# Patient Record
Sex: Female | Born: 1970 | Race: White | Hispanic: No | Smoking: Never smoker
Health system: Southern US, Community
[De-identification: ages and names within clinical notes are randomized; demographics above are authoritative.]

## PROBLEM LIST (undated history)

## (undated) DIAGNOSIS — K219 Gastro-esophageal reflux disease without esophagitis: Secondary | ICD-10-CM

## (undated) DIAGNOSIS — Z8639 Personal history of other endocrine, nutritional and metabolic disease: Secondary | ICD-10-CM

## (undated) DIAGNOSIS — E785 Hyperlipidemia, unspecified: Secondary | ICD-10-CM

## (undated) DIAGNOSIS — E039 Hypothyroidism, unspecified: Secondary | ICD-10-CM

## (undated) DIAGNOSIS — C4490 Unspecified malignant neoplasm of skin, unspecified: Secondary | ICD-10-CM

## (undated) DIAGNOSIS — T7840XA Allergy, unspecified, initial encounter: Secondary | ICD-10-CM

## (undated) DIAGNOSIS — O009 Unspecified ectopic pregnancy without intrauterine pregnancy: Secondary | ICD-10-CM

## (undated) DIAGNOSIS — G43909 Migraine, unspecified, not intractable, without status migrainosus: Secondary | ICD-10-CM

## (undated) DIAGNOSIS — T4145XA Adverse effect of unspecified anesthetic, initial encounter: Secondary | ICD-10-CM

## (undated) DIAGNOSIS — I10 Essential (primary) hypertension: Secondary | ICD-10-CM

## (undated) DIAGNOSIS — Z9889 Other specified postprocedural states: Secondary | ICD-10-CM

## (undated) DIAGNOSIS — T8859XA Other complications of anesthesia, initial encounter: Secondary | ICD-10-CM

## (undated) DIAGNOSIS — R112 Nausea with vomiting, unspecified: Secondary | ICD-10-CM

## (undated) DIAGNOSIS — K635 Polyp of colon: Secondary | ICD-10-CM

## (undated) DIAGNOSIS — E042 Nontoxic multinodular goiter: Secondary | ICD-10-CM

## (undated) HISTORY — DX: Unspecified ectopic pregnancy without intrauterine pregnancy: O00.90

## (undated) HISTORY — PX: MOHS SURGERY: SUR867

## (undated) HISTORY — DX: Allergy, unspecified, initial encounter: T78.40XA

## (undated) HISTORY — DX: Hyperlipidemia, unspecified: E78.5

## (undated) HISTORY — DX: Polyp of colon: K63.5

## (undated) HISTORY — PX: DILATION AND CURETTAGE OF UTERUS: SHX78

## (undated) HISTORY — DX: Nontoxic multinodular goiter: E04.2

---

## 2000-04-21 ENCOUNTER — Encounter: Payer: Self-pay | Admitting: Family Medicine

## 2000-04-21 ENCOUNTER — Other Ambulatory Visit: Admission: RE | Admit: 2000-04-21 | Discharge: 2000-04-21 | Payer: Self-pay | Admitting: Family Medicine

## 2000-04-21 ENCOUNTER — Encounter: Admission: RE | Admit: 2000-04-21 | Discharge: 2000-04-21 | Payer: Self-pay | Admitting: Family Medicine

## 2000-04-22 ENCOUNTER — Encounter: Payer: Self-pay | Admitting: Family Medicine

## 2000-04-22 ENCOUNTER — Encounter: Admission: RE | Admit: 2000-04-22 | Discharge: 2000-04-22 | Payer: Self-pay | Admitting: Family Medicine

## 2000-04-27 ENCOUNTER — Encounter: Admission: RE | Admit: 2000-04-27 | Discharge: 2000-04-27 | Payer: Self-pay | Admitting: Family Medicine

## 2000-04-27 ENCOUNTER — Encounter: Payer: Self-pay | Admitting: Family Medicine

## 2000-06-16 ENCOUNTER — Ambulatory Visit (HOSPITAL_COMMUNITY): Admission: RE | Admit: 2000-06-16 | Discharge: 2000-06-16 | Payer: Self-pay | Admitting: Gastroenterology

## 2001-06-21 ENCOUNTER — Encounter: Payer: Self-pay | Admitting: Family Medicine

## 2001-06-21 ENCOUNTER — Encounter: Admission: RE | Admit: 2001-06-21 | Discharge: 2001-06-21 | Payer: Self-pay | Admitting: Family Medicine

## 2001-12-16 ENCOUNTER — Encounter: Payer: Self-pay | Admitting: Gynecology

## 2001-12-16 ENCOUNTER — Encounter: Admission: RE | Admit: 2001-12-16 | Discharge: 2001-12-16 | Payer: Self-pay | Admitting: Gynecology

## 2002-05-14 ENCOUNTER — Inpatient Hospital Stay (HOSPITAL_COMMUNITY): Admission: AD | Admit: 2002-05-14 | Discharge: 2002-05-14 | Payer: Self-pay | Admitting: Gynecology

## 2002-05-14 ENCOUNTER — Encounter: Payer: Self-pay | Admitting: Gynecology

## 2002-10-25 ENCOUNTER — Encounter: Payer: Self-pay | Admitting: Family Medicine

## 2002-10-25 ENCOUNTER — Encounter: Admission: RE | Admit: 2002-10-25 | Discharge: 2002-10-25 | Payer: Self-pay | Admitting: Family Medicine

## 2003-05-15 ENCOUNTER — Other Ambulatory Visit: Admission: RE | Admit: 2003-05-15 | Discharge: 2003-05-15 | Payer: Self-pay | Admitting: Gynecology

## 2003-09-08 HISTORY — PX: LAPAROSCOPIC CHOLECYSTECTOMY: SUR755

## 2003-12-26 ENCOUNTER — Encounter: Admission: RE | Admit: 2003-12-26 | Discharge: 2003-12-26 | Payer: Self-pay | Admitting: Family Medicine

## 2004-01-09 ENCOUNTER — Encounter: Admission: RE | Admit: 2004-01-09 | Discharge: 2004-01-09 | Payer: Self-pay | Admitting: Gastroenterology

## 2004-02-07 ENCOUNTER — Ambulatory Visit (HOSPITAL_COMMUNITY): Admission: RE | Admit: 2004-02-07 | Discharge: 2004-02-08 | Payer: Self-pay | Admitting: Surgery

## 2004-02-07 ENCOUNTER — Encounter (INDEPENDENT_AMBULATORY_CARE_PROVIDER_SITE_OTHER): Payer: Self-pay | Admitting: *Deleted

## 2004-05-06 ENCOUNTER — Inpatient Hospital Stay (HOSPITAL_COMMUNITY): Admission: AD | Admit: 2004-05-06 | Discharge: 2004-05-06 | Payer: Self-pay | Admitting: Gynecology

## 2004-05-12 ENCOUNTER — Inpatient Hospital Stay (HOSPITAL_COMMUNITY): Admission: AD | Admit: 2004-05-12 | Discharge: 2004-05-12 | Payer: Self-pay | Admitting: Gynecology

## 2004-06-05 ENCOUNTER — Encounter: Admission: RE | Admit: 2004-06-05 | Discharge: 2004-06-05 | Payer: Self-pay | Admitting: Gynecology

## 2004-06-27 ENCOUNTER — Other Ambulatory Visit: Admission: RE | Admit: 2004-06-27 | Discharge: 2004-06-27 | Payer: Self-pay | Admitting: Gynecology

## 2005-07-27 ENCOUNTER — Encounter: Admission: RE | Admit: 2005-07-27 | Discharge: 2005-07-27 | Payer: Self-pay | Admitting: Gynecology

## 2005-08-20 ENCOUNTER — Other Ambulatory Visit: Admission: RE | Admit: 2005-08-20 | Discharge: 2005-08-20 | Payer: Self-pay | Admitting: Gynecology

## 2006-03-18 ENCOUNTER — Other Ambulatory Visit: Admission: RE | Admit: 2006-03-18 | Discharge: 2006-03-18 | Payer: Self-pay | Admitting: Gynecology

## 2006-03-19 ENCOUNTER — Encounter: Admission: RE | Admit: 2006-03-19 | Discharge: 2006-03-19 | Payer: Self-pay | Admitting: Family Medicine

## 2006-03-26 ENCOUNTER — Encounter: Admission: RE | Admit: 2006-03-26 | Discharge: 2006-03-26 | Payer: Self-pay | Admitting: Family Medicine

## 2006-05-06 ENCOUNTER — Encounter: Admission: RE | Admit: 2006-05-06 | Discharge: 2006-05-06 | Payer: Self-pay | Admitting: Otolaryngology

## 2006-08-09 ENCOUNTER — Encounter: Admission: RE | Admit: 2006-08-09 | Discharge: 2006-08-09 | Payer: Self-pay | Admitting: Gynecology

## 2006-09-20 ENCOUNTER — Other Ambulatory Visit: Admission: RE | Admit: 2006-09-20 | Discharge: 2006-09-20 | Payer: Self-pay | Admitting: Gynecology

## 2007-01-05 ENCOUNTER — Ambulatory Visit: Payer: Self-pay

## 2007-01-05 ENCOUNTER — Ambulatory Visit: Payer: Self-pay | Admitting: Cardiology

## 2007-01-05 LAB — CONVERTED CEMR LAB: Pro B Natriuretic peptide (BNP): 11 pg/mL (ref 0.0–100.0)

## 2007-01-20 ENCOUNTER — Encounter: Payer: Self-pay | Admitting: Cardiovascular Disease

## 2007-01-20 ENCOUNTER — Ambulatory Visit: Payer: Self-pay

## 2007-02-16 ENCOUNTER — Ambulatory Visit: Payer: Self-pay | Admitting: Cardiology

## 2007-05-17 ENCOUNTER — Other Ambulatory Visit: Admission: RE | Admit: 2007-05-17 | Discharge: 2007-05-17 | Payer: Self-pay | Admitting: Gynecology

## 2007-08-15 ENCOUNTER — Encounter: Admission: RE | Admit: 2007-08-15 | Discharge: 2007-08-15 | Payer: Self-pay | Admitting: Family Medicine

## 2007-08-16 ENCOUNTER — Encounter: Admission: RE | Admit: 2007-08-16 | Discharge: 2007-08-16 | Payer: Self-pay | Admitting: Gynecology

## 2007-08-26 ENCOUNTER — Encounter: Admission: RE | Admit: 2007-08-26 | Discharge: 2007-08-26 | Payer: Self-pay | Admitting: Family Medicine

## 2007-08-26 ENCOUNTER — Encounter (INDEPENDENT_AMBULATORY_CARE_PROVIDER_SITE_OTHER): Payer: Self-pay | Admitting: Interventional Radiology

## 2007-08-26 ENCOUNTER — Other Ambulatory Visit: Admission: RE | Admit: 2007-08-26 | Discharge: 2007-08-26 | Payer: Self-pay | Admitting: Interventional Radiology

## 2010-02-24 DIAGNOSIS — G43909 Migraine, unspecified, not intractable, without status migrainosus: Secondary | ICD-10-CM | POA: Insufficient documentation

## 2010-06-23 ENCOUNTER — Encounter: Admission: RE | Admit: 2010-06-23 | Discharge: 2010-06-23 | Payer: Self-pay | Admitting: Family Medicine

## 2010-08-28 DIAGNOSIS — K589 Irritable bowel syndrome without diarrhea: Secondary | ICD-10-CM | POA: Insufficient documentation

## 2010-09-28 ENCOUNTER — Encounter: Payer: Self-pay | Admitting: Family Medicine

## 2010-10-10 DIAGNOSIS — B009 Herpesviral infection, unspecified: Secondary | ICD-10-CM | POA: Insufficient documentation

## 2010-11-12 ENCOUNTER — Other Ambulatory Visit: Payer: Self-pay | Admitting: Endocrinology

## 2010-11-12 DIAGNOSIS — E042 Nontoxic multinodular goiter: Secondary | ICD-10-CM

## 2011-01-06 ENCOUNTER — Other Ambulatory Visit: Payer: Self-pay | Admitting: Gynecology

## 2011-01-20 NOTE — Assessment & Plan Note (Signed)
[facility]                            CARDIOLOGY OFFICE NOTE   FALYN, RUBEL                       MRN:          [id number]  DATE:02/16/2007                            DOB:          March 12, 1971    Ms. Lusher is pleasant, 40 year old female that I recently saw on January 05, 2007 for complaints of atypical chest pain and dyspnea. Note a D-  dimer was normal. We also scheduled her to have lower extremity Dopplers  that showed no evidence of DVT. She had an echocardiogram performed on  Jan 20, 2007 that showed normal LV function. There was mild mitral  regurgitation and the left atrium was mildly dilated. There was mild  tricuspid regurgitation. She also had a Myoview performed on Jan 20, 2007. There was breast attenuation but there was no significant ischemia  noted. The ejection fraction was 70%. Finally she had a BNP that was  normal at 11. Since I last saw her, she states she still has mild  dyspnea with more moderate activity but it has improved. She has had 2  brief episodes of chest pain that are in the substernal location. The  pain did not radiate. There was a pleuritic component she stated and it  lasted for approximately 15 minutes each. There is no orthopnea, PND or  pedal edema and there is no syncope.   CURRENT MEDICATIONS:  1. Topamax 50 mg p.o. daily.  2. Imitrex.  3. Calcium.  4. Vitamin D.   PHYSICAL EXAMINATION:  VITAL SIGNS:  Blood pressure of 119/86 and her  pulse was 70. She weighs 188 pounds.  HEENT:  Normal.  NECK:  Supple.  CHEST:  Clear.  CARDIOVASCULAR:  Regular rate and rhythm.  EXTREMITIES:  Show no edema.   DIAGNOSES:  Atypical chest pain/dyspnea - the etiology of this is not  clear to me. However, her Myoview shows no ischemia and her echo shows  normal left ventricular function. Her D-dimer was normal as was her  lower extremity Dopplers. There is also a BNP that was normal. I  therefore think it is very  unlikely that this is related to her cardiac  status. We have discussed this as well. Note her symptoms have improved  since I saw her before. We will therefore not pursue further cardiac  workup and I will see her back on an as needed basis. If her dyspnea  persists then she will see Dr. Duaine Dredge for consideration of pulmonary  workup.     Madolyn Frieze Jens Som, MD, [facility]  Electronically Signed   BSC/MedQ  DD: 02/16/2007  DT: 02/16/2007  Job #: [id number]   cc:   Mosetta Putt, M.D.

## 2011-01-23 NOTE — Procedures (Signed)
[facility]  Patient:    Angelica Lester, Angelica Lester                       MRN: [id number] Proc. Date: 06/16/00 Adm. Date:  [id number] Disc. Date: [id number] Attending:  Charna Elizabeth CC:         Carolyne Fiscal, M.D.   Procedure Report  DATE OF BIRTH:  [birth year]  REFERRING PHYSICIAN:  Carolyne Fiscal, M.D.  PROCEDURE PERFORMED:  Colonoscopy.  ENDOSCOPIST:  Anselmo Rod, M.D.  INSTRUMENT USED:  Olympus video colonoscope.  INDICATIONS FOR PROCEDURE:  The patient is a 40 year old white female with intrarectal cancer in a grandmother and history of blood in stool, rule out colonic polyps, masses, hemorrhoids, etc.  PREPROCEDURE PREPARATION:  Informed consent was procured from the patient. The patient was fasted for eight hours prior to the procedure and prepped with a bottle of magnesium citrate and a gallon of NuLytely the night prior to the procedure.  PREPROCEDURE PHYSICAL:  The patient had stable vital signs.  Neck supple. Chest clear to auscultation.  S1, S2 regular.  Abdomen soft with normal abdominal bowel sounds.  DESCRIPTION OF PROCEDURE:  The patient was placed in the left lateral decubitus position and sedated with 60 mg of Demerol and 7 mg of Versed intravenously.  Once the patient was adequately sedated and maintained on low-flow oxygen and continuous cardiac monitoring, the Olympus video colonoscope was advanced from the rectum to the cecum without difficulty.  The entire colonic mucosa appeared healthy with a normal vascular pattern, no masses, polyps, erosions, ulcerations or diverticula seen.  IMPRESSION:  Normal colonoscopy.  RECOMMENDATIONS: 1. Outpatient follow-up for repeat guaiac testing. 2. Increase the fluid and fiber in her diet.  Further recommendations to be made on an outpatient basis.DD:  06/16/00 TD:  06/18/00 Job: [id number]

## 2011-01-23 NOTE — Assessment & Plan Note (Signed)
[facility]                            CARDIOLOGY OFFICE NOTE   [name], [name]                     MRN:          [id number]  DATE:01/05/2007                            DOB:          Mar 18, 1971    Ms. Angelica Lester is a very pleasant 40 year Lester female with a past medical  history of mild hyperlipidemia, question irritable bowel syndrome, who I  am asked to evaluate for chest pain and dyspnea.  She has no prior  cardiac history.  She typically does not have dyspnea on exertion,  orthopnea, PND, pedal edema, palpitations, presyncope, syncope, or  exertional chest pain.  Over the past 1 month, she has noticed worsening  dyspnea on exertion.  There is no orthopnea or PND, and there is no  pedal edema.  Approximately 2 weeks ago, she had 2 different types of  chest pain.  One was described as sharp sensation in the left upper  chest.  The other was described as an elephant sitting on her chest.  The pain did not radiate.  It did increase with inspiration, but it was  not related to food.  There was no associated nausea or vomiting, but  there was minimal diaphoresis, and mild shortness of breath.  It lasted  for approximately 1-1/2 hours.  She had a 2nd episode 2 days later, but  has had no chest pain since then.  She did state that her dyspnea is  also improved, although it is still present.  She was seen by Dr.  Duaine Dredge, and he ordered laboratory.  Her CK-MB was normal.  Her  hemoglobin and hematocrit were normal.  She also had a D-dimer that was  normal at less than 0.22.  A troponin was also normal.  Chest x-ray  showed no active lung disease.  An electrocardiogram showed mild  anterior T wave inversion.  Because of her symptoms, we were asked to  further evaluate.  Of note, she has not had chest pain since a week and  a half ago.   Her medications include Topamax, Imitrex, calcium, and vitamin D.   SHE HAS NO KNOWN DRUG ALLERGIES.   SOCIAL  HISTORY:  She does not smoke, and only rarely consumes alcohol.   FAMILY HISTORY:  Positive for coronary artery disease.  Her sister had a  myocardial infarction at age 78.   PAST MEDICAL HISTORY:  There is no diabetes mellitus or hypertension,  but there is mild hyperlipidemia in the past.  There is a question of  history of irritable bowel syndrome.  She has had a prior  cholecystectomy.   REVIEW OF SYSTEMS:  She denies any headaches, fevers or chills.  There  is no productive cough or hemoptysis.  There is no dysphagia,  odynophagia, melena, or hematochezia.  There is no dysuria or hematuria.  There is no history of seizure activity.  There is no orthopnea, PND, or  pedal edema.  She has had some pain in her right lower extremity from  the knee down.  This is predominantly anteriorly.  Also, of note, she  has not had any  recent travel, or leg injury.  The remainder of the  systems are negative.   PHYSICAL EXAMINATION:  Shows a blood pressure of 118/83, and her pulse  is 66.  She weighs 185 pounds.  She is well developed, and mildly obese.  She is in no acute distress.  SKIN:  Warm and dry.  She does not appear to be depressed.  There is no peripheral clubbing.  HEENT:  Normal with normal eyelids.  NECK:  Supple with a normal upstroke bilaterally.  There are no bruits  noted.  There is no jugular venous distention, and no thyromegaly is  noted.  BACK:  Normal.  CHEST:  Clear to auscultation with normal expansion.  CARDIOVASCULAR EXAM:  Reveals a respiratory rate with normal S1 and S2.  There are no murmurs, rubs, or gallops noted.  There is no change with  Valsalva.  Her PMI is nondisplaced.  ABDOMINAL EXAM:  Non-tender, non-distended.  Positive bowel sounds.  No  hepatosplenomegaly.  No masses appreciated.  There is no abdominal  bruit.  She has 2+ femoral pulses bilaterally.  No bruits.  EXTREMITIES:  Show no edema.  I can palpate no cords.  She has a  negative Homan's  bilaterally.  She has 2+ dorsalis pedis pulses  bilaterally.  NEUROLOGIC EXAM:  Grossly intact.  Her electrocardiogram shows a sinus rhythm at a rate of 60.  There is  subtle anterior T wave inversion, which his unchanged from previous  electrocardiogram at Dr. Geoffery Lyons office.   DIAGNOSES:  1. Atypical chest pain/dyspnea.  The etiology of this is not clear to      me.  She states her dyspnea began approximately 1 month ago.  She      has not had any recent travel or leg injury, but she did state      there was a pleuritic component, and there is anterior T wave      inversion, which could be consistent right ventricular strain.      However, her D-dimer at Dr. Geoffery Lyons office was normal.  I will      schedule her to have lower extremity venous Dopplers to make sure      there is no deep venous thrombosis.  I think if this is normal,      then the chances of a pulmonary embolus are very unlikely.  We will      also check a BNP.  Her symptoms are somewhat atypical for ischemia,      but we will schedule her for a stress Myoview for risk      stratification as well as an echocardiogram to quantify her left      ventricular function.  We will have her follow up in 4 to 6 weeks      to review the above information.  2. History of question of irritable bowel syndrome.  The patient will      follow up with Dr. Duaine Dredge concerning this particular issue.   We will see her back in 4 to 6 weeks.     Madolyn Frieze Jens Som, MD, [facility]  Electronically Signed    BSC/MedQ  DD: 01/05/2007  DT: 01/05/2007  Job #: [id number]   cc:   Mosetta Putt, M.D.

## 2011-01-23 NOTE — Op Note (Signed)
NAMEPANSY, Angelica Lester                        ACCOUNT NO.:  [id number]   MEDICAL RECORD NO.:  [id number]                   PATIENT TYPE:  OIB   LOCATION:  NA                                   FACILITY:  [facility]   PHYSICIAN:  Abigail Miyamoto, M.D.              DATE OF BIRTH:  1970/11/18   DATE OF PROCEDURE:  02/07/2004  DATE OF DISCHARGE:                                 OPERATIVE REPORT   PREOPERATIVE DIAGNOSIS:  Biliary dyskinesia.   POSTOPERATIVE DIAGNOSIS:  Biliary dyskinesia.   PROCEDURE:  Laparoscopic cholecystectomy with intraoperative cholangiogram.   SURGEON:  Abigail Miyamoto, M.D.   ASSISTANT:  Jimmye Norman, M.D.   ANESTHESIA:  General endotracheal.   ESTIMATED BLOOD LOSS:  Minimal.   FINDINGS:  The patient was found to have a normal cholangiogram.   DESCRIPTION OF PROCEDURE:  The patient was taken to the operating room,  identified as Angelica Lester. She was placed supine on the operating table  and general anesthesia was induced. Her abdomen was then prepped and draped  in the usual sterile fashion.  Using a #15 blade, a small transverse  incision was made below the umbilicus.  Incision was carried down to the  fascia, which was then opened with a scalpel.  Hemostat was used to pass  through the peritoneal cavity.  Next, a 0-Vicryl pursestring suture was then  placed around the fascial opening.  The Hasson port was placed through the  opening and insufflation of the abdomen was begun.  A 12 mm port was placed  in the epigastrium and two 5 mm ports placed in the patient's right flank  under direct vision.   The gallbladder was then grasped and retracted above the liver bed.  The  cystic duct and cystic artery were easily dissected out.  The duct was  clipped once distally.  The artery was then clipped twice proximally, once  distally and transected.  The cystic duct was then  opened with laparoscopic  scissors.  An Angiocath was inserted under direct vision in the  right upper  quadrant. The Cholangiocatheter was passed through this and placed into the  opening in the small cystic duct. The cholangiogram was then performed under  direct fluoroscopy.  Contrast was seen to flow into the entire biliary  system and duodenum without evidence of abnormality.  At this point, the  Cholangiocatheter was removed.  The cystic duct was then clipped 3 times  proximally and completely transected.   The gallbladder was then slowly dissected free from the liver bed with  electrocautery.  Once it was free from the liver bed,  it was placed in an Endo-sac and removed through the incision at the  umbilicus.  The 0-Vicryl at the umbilicus was tied in place closing the  fascial defect.  The abdomen was irrigated with normal saline, again,  hemostasis appeared to be achieved.  All ports were removed under direct  vision and the abdomen was deflated.  All incisions were then anesthetized  with 0.25% Marcaine and closed with 4-0 Vicryl subcuticular sutures.  Steri-  Strips, gauze and tape  were then applied.  The patient tolerated the procedure well. All sponge,  instrument and needle counts were correct at the end of the procedure.  The  patient was then extubated in the operating room, taken to the recovery room  in stable condition.                                               Abigail Miyamoto, M.D.    DB/MEDQ  D:  02/07/2004  T:  02/07/2004  Job:  [id number]   cc:   Anselmo Rod, M.D.  [address]  [address]  [place]  [place] [address]  Fax: [phone]

## 2011-05-15 ENCOUNTER — Ambulatory Visit
Admission: RE | Admit: 2011-05-15 | Discharge: 2011-05-15 | Disposition: A | Discharge status: Home / Self Care | Payer: Managed Care, Other (non HMO) | Source: Ambulatory Visit | Attending: Endocrinology | Admitting: Endocrinology

## 2011-05-15 DIAGNOSIS — E042 Nontoxic multinodular goiter: Secondary | ICD-10-CM

## 2011-05-18 ENCOUNTER — Other Ambulatory Visit: Payer: Self-pay

## 2011-12-07 ENCOUNTER — Other Ambulatory Visit: Payer: Self-pay | Admitting: Endocrinology

## 2011-12-07 DIAGNOSIS — E041 Nontoxic single thyroid nodule: Secondary | ICD-10-CM

## 2012-03-08 ENCOUNTER — Other Ambulatory Visit: Payer: Self-pay | Admitting: Family Medicine

## 2012-03-08 DIAGNOSIS — E041 Nontoxic single thyroid nodule: Secondary | ICD-10-CM

## 2012-03-23 ENCOUNTER — Ambulatory Visit
Admission: RE | Admit: 2012-03-23 | Discharge: 2012-03-23 | Disposition: A | Discharge status: Home / Self Care | Payer: Managed Care, Other (non HMO) | Source: Ambulatory Visit | Attending: Family Medicine | Admitting: Family Medicine

## 2012-03-23 DIAGNOSIS — E041 Nontoxic single thyroid nodule: Secondary | ICD-10-CM

## 2012-05-16 ENCOUNTER — Other Ambulatory Visit: Payer: Managed Care, Other (non HMO)

## 2013-06-27 ENCOUNTER — Other Ambulatory Visit: Payer: Self-pay | Admitting: Endocrinology

## 2013-06-27 DIAGNOSIS — E042 Nontoxic multinodular goiter: Secondary | ICD-10-CM

## 2013-07-07 ENCOUNTER — Ambulatory Visit
Admission: RE | Admit: 2013-07-07 | Discharge: 2013-07-07 | Disposition: A | Discharge status: Home / Self Care | Payer: Managed Care, Other (non HMO) | Source: Ambulatory Visit | Attending: Endocrinology | Admitting: Endocrinology

## 2013-07-07 DIAGNOSIS — E042 Nontoxic multinodular goiter: Secondary | ICD-10-CM

## 2013-07-19 ENCOUNTER — Other Ambulatory Visit: Payer: Self-pay | Admitting: Endocrinology

## 2013-07-19 DIAGNOSIS — E049 Nontoxic goiter, unspecified: Secondary | ICD-10-CM

## 2013-09-26 ENCOUNTER — Other Ambulatory Visit: Payer: Self-pay | Admitting: Dermatology

## 2013-09-27 DIAGNOSIS — E042 Nontoxic multinodular goiter: Secondary | ICD-10-CM | POA: Insufficient documentation

## 2013-09-27 DIAGNOSIS — Z800 Family history of malignant neoplasm of digestive organs: Secondary | ICD-10-CM | POA: Insufficient documentation

## 2013-09-27 DIAGNOSIS — N946 Dysmenorrhea, unspecified: Secondary | ICD-10-CM | POA: Insufficient documentation

## 2013-09-27 DIAGNOSIS — K219 Gastro-esophageal reflux disease without esophagitis: Secondary | ICD-10-CM | POA: Insufficient documentation

## 2013-09-27 HISTORY — DX: Nontoxic multinodular goiter: E04.2

## 2013-09-27 LAB — HM PAP SMEAR: HM Pap smear: NEGATIVE

## 2013-11-08 ENCOUNTER — Other Ambulatory Visit: Payer: Self-pay | Admitting: Dermatology

## 2014-01-15 DIAGNOSIS — J302 Other seasonal allergic rhinitis: Secondary | ICD-10-CM | POA: Insufficient documentation

## 2014-07-09 ENCOUNTER — Other Ambulatory Visit: Payer: Managed Care, Other (non HMO)

## 2014-07-09 ENCOUNTER — Ambulatory Visit
Admission: RE | Admit: 2014-07-09 | Discharge: 2014-07-09 | Disposition: A | Discharge status: Home / Self Care | Payer: Managed Care, Other (non HMO) | Source: Ambulatory Visit | Attending: Endocrinology | Admitting: Endocrinology

## 2014-07-09 DIAGNOSIS — E049 Nontoxic goiter, unspecified: Secondary | ICD-10-CM

## 2014-07-23 ENCOUNTER — Other Ambulatory Visit: Payer: Self-pay | Admitting: Endocrinology

## 2014-07-23 DIAGNOSIS — E041 Nontoxic single thyroid nodule: Secondary | ICD-10-CM

## 2014-09-13 ENCOUNTER — Other Ambulatory Visit: Payer: Managed Care, Other (non HMO)

## 2014-09-27 ENCOUNTER — Other Ambulatory Visit: Payer: Self-pay | Admitting: Dermatology

## 2014-12-27 ENCOUNTER — Other Ambulatory Visit: Payer: Self-pay | Admitting: Dermatology

## 2015-01-20 IMAGING — US US SOFT TISSUE HEAD/NECK
1 series · 14 of 25 positions shown · non-contrast
Comparison: 07/07/2013

CLINICAL DATA: Multinodular goiter, subsequent exam

EXAM:
THYROID ULTRASOUND
TECHNIQUE: Ultrasound examination of the thyroid gland and adjacent soft
tissues was performed.

[Series 1: us soft tissue head/neck · 0.08mm/px · 14 of 42 slices shown]
[im 1/42]
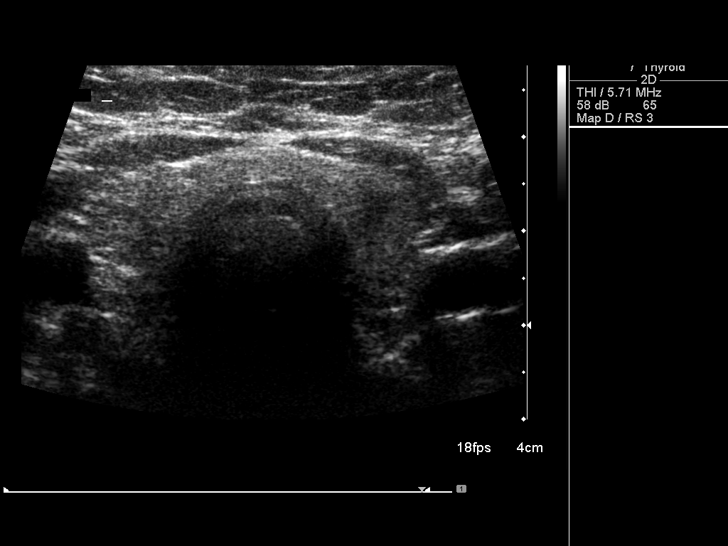
[im 4/42]
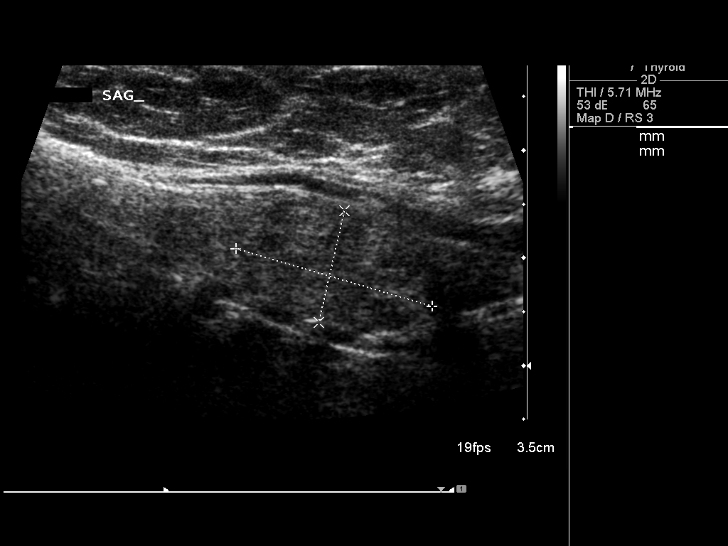
[im 7/42]
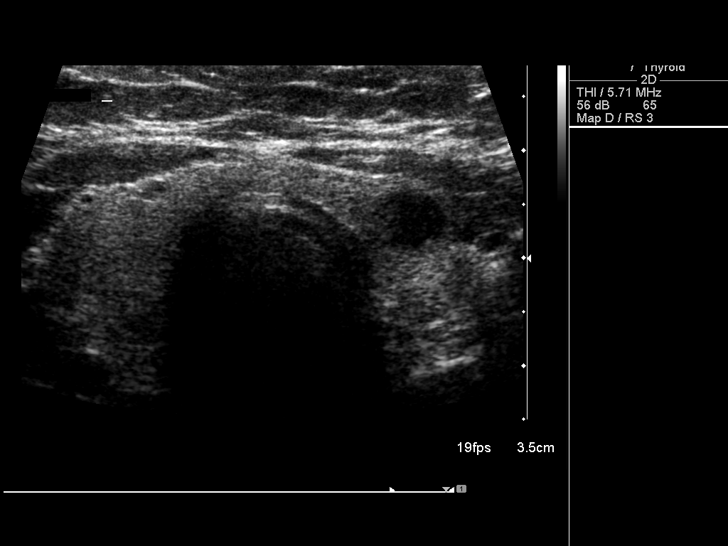
[im 11/42]
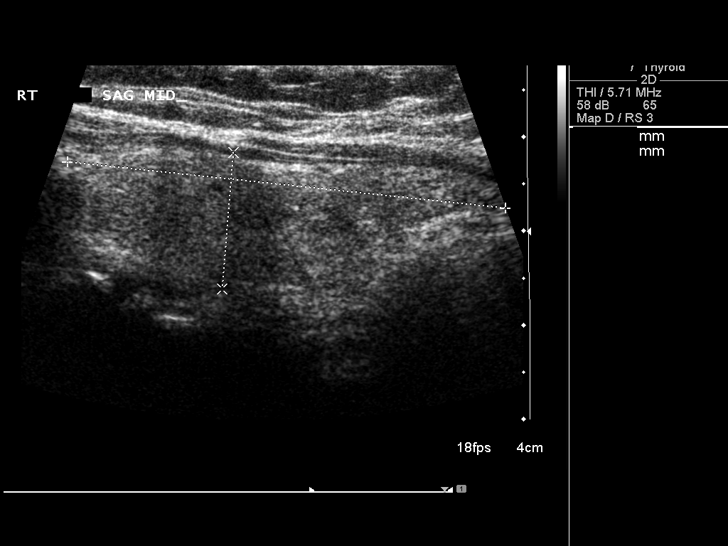
[im 14/42]
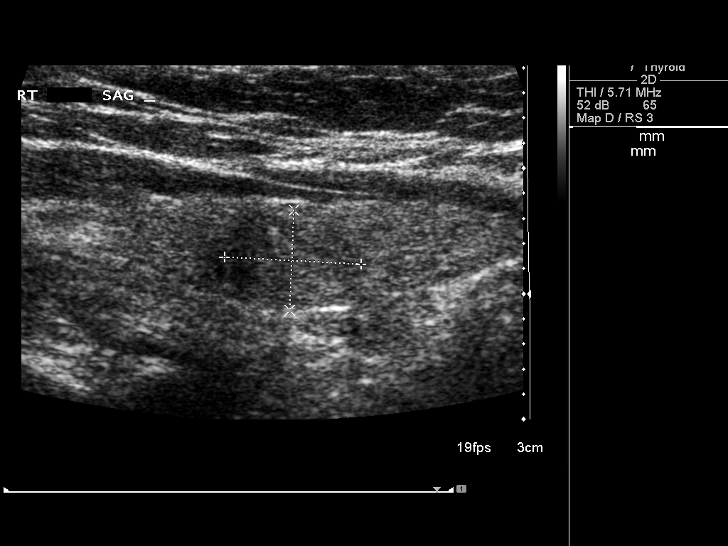
[im 16/42]
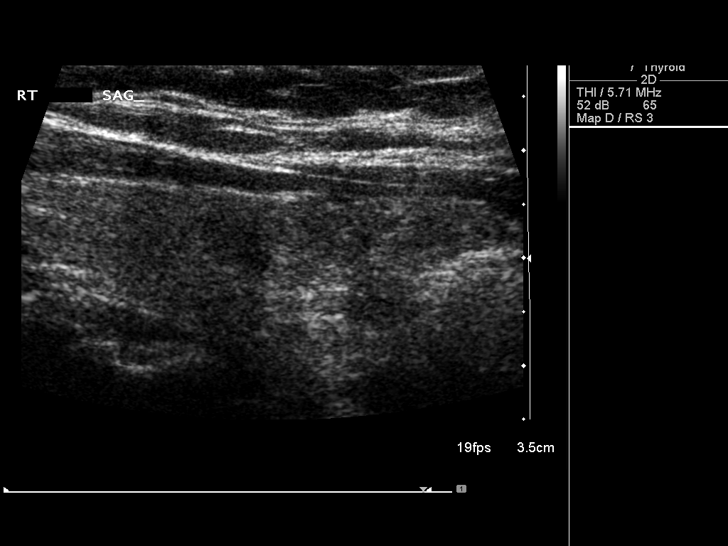
[im 19/42]
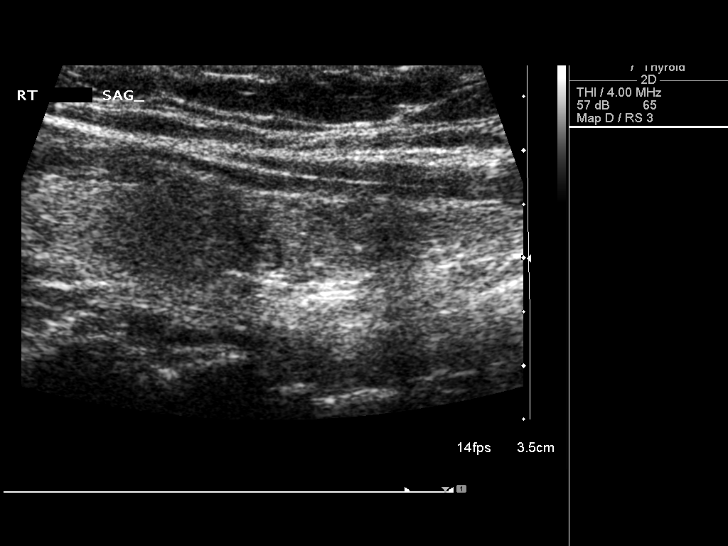
[im 23/42]
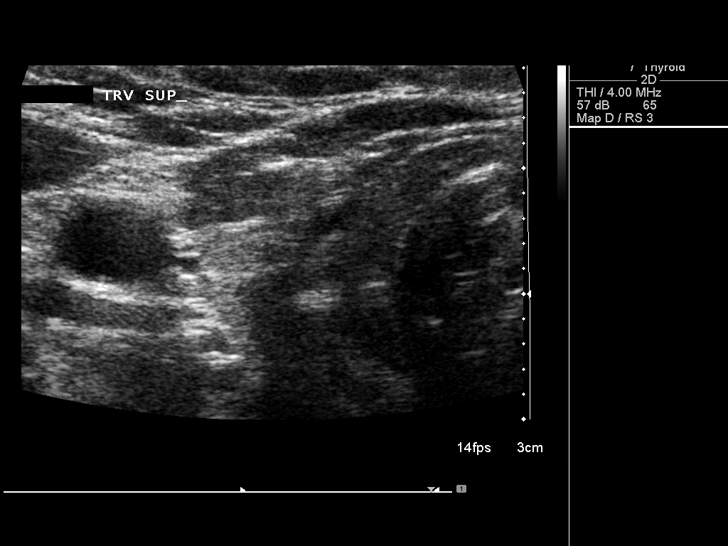
[im 26/42]
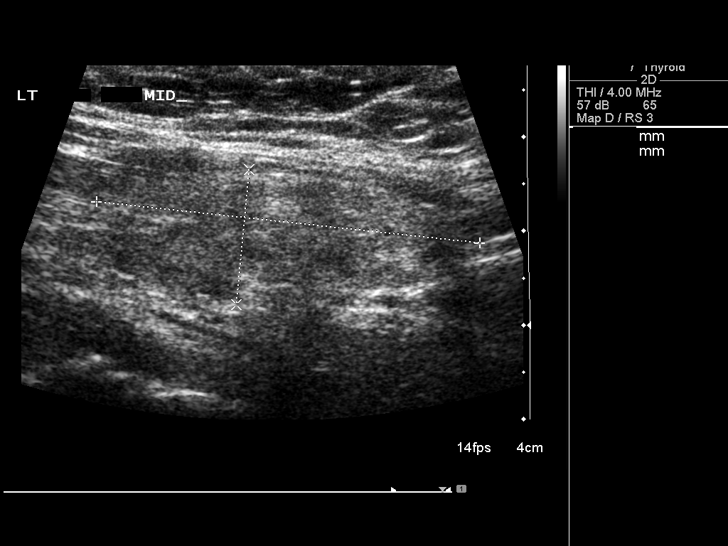
[im 28/42]
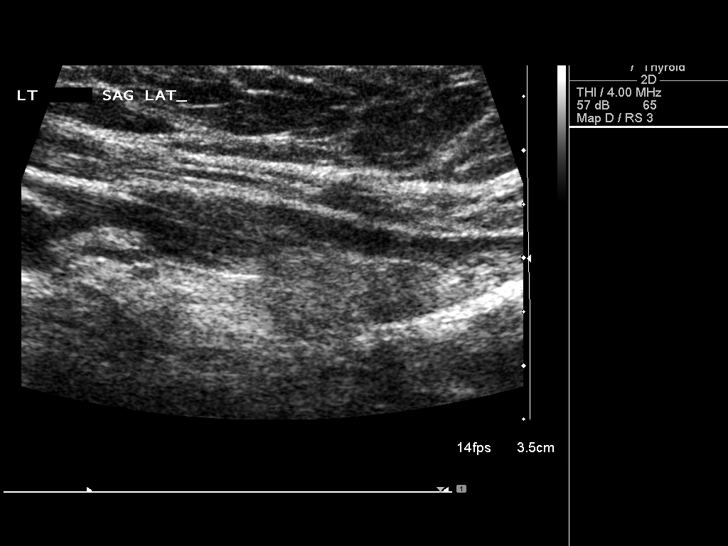
[im 31/42]
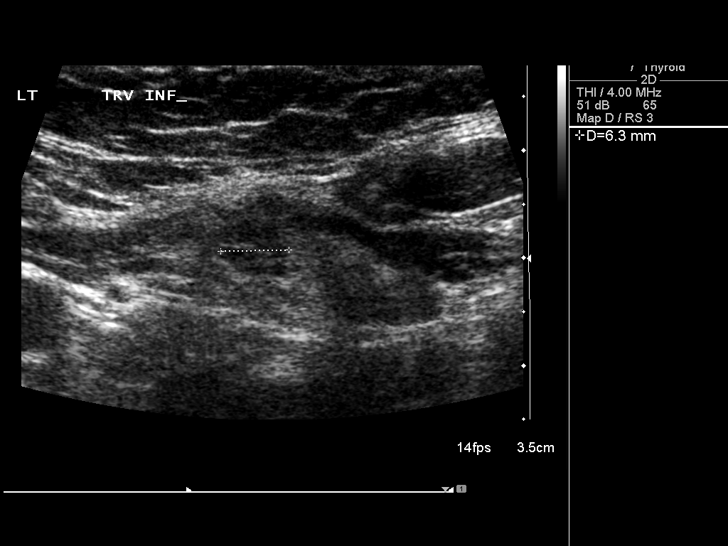
[im 35/42]
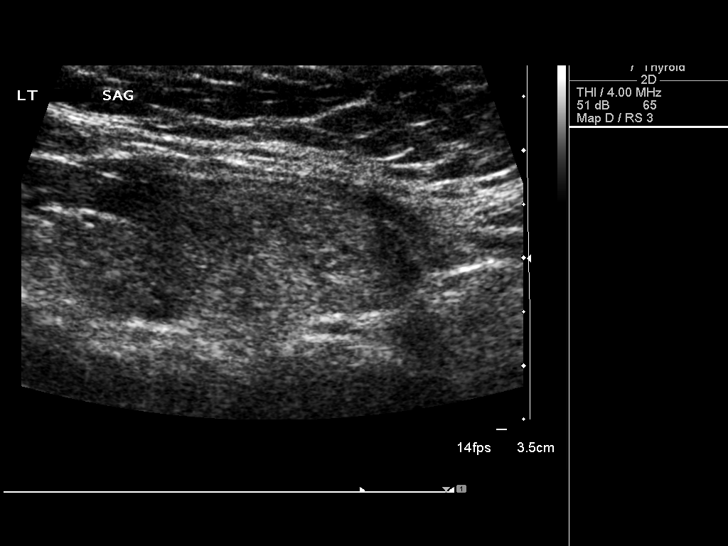
[im 38/42]
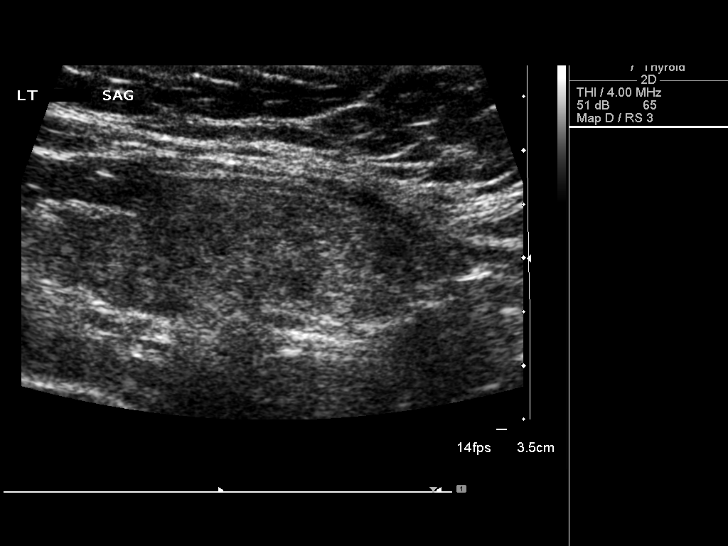
[im 42/42]
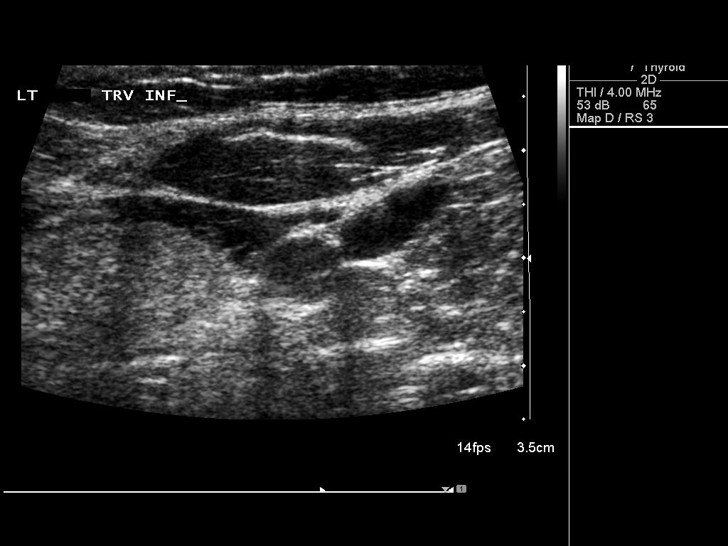

[14 of 25 positions shown; findings below may reference images not displayed]

FINDINGS: Right thyroid lobe

Measurements: 4.7 x 1.5 x 1.4 cm. Similar heterogeneous gland
echotexture. Stable dominant solid right midpole nodule measures 11
x 8 x 9 mm. No interval change.

Left thyroid lobe

Measurements: 4.1 x 1.4 x 1.5 cm. Stable heterogeneous gland
echotexture and small scattered sub cm nodules. No significant
interval change. Scattered left thyroid nodules measure 7 mm or less
in size.

Isthmus

Thickness: 3 mm. Dominant left inferior exophytic solid nodule
measures 19 x 11 x 18 mm, unchanged. Adjacent 10 mm hypoechoic
isthmus nodule again noted, unchanged.

Lymphadenopathy

None visualized.
IMPRESSION: Stable thyroid nodules. Measures as above. No significant interval
change.

## 2015-07-15 ENCOUNTER — Other Ambulatory Visit: Payer: Managed Care, Other (non HMO)

## 2015-07-15 ENCOUNTER — Ambulatory Visit
Admission: RE | Admit: 2015-07-15 | Discharge: 2015-07-15 | Disposition: A | Discharge status: Home / Self Care | Payer: Managed Care, Other (non HMO) | Source: Ambulatory Visit | Attending: Endocrinology | Admitting: Endocrinology

## 2015-07-15 DIAGNOSIS — E041 Nontoxic single thyroid nodule: Secondary | ICD-10-CM

## 2015-07-23 ENCOUNTER — Other Ambulatory Visit: Payer: Self-pay | Admitting: Endocrinology

## 2015-07-23 DIAGNOSIS — E041 Nontoxic single thyroid nodule: Secondary | ICD-10-CM

## 2016-01-26 IMAGING — US US SOFT TISSUE HEAD/NECK
1 series · 13 of 25 positions shown · non-contrast
Comparison: Multiple prior studies dating back to 0991. The most
recent comparison study is dated 07/09/2014

CLINICAL DATA: History of multinodular goiter with prior biopsy of
dominant left lobe/ isthmus nodule in 8774.

EXAM:
THYROID ULTRASOUND
TECHNIQUE: Ultrasound examination of the thyroid gland and adjacent soft
tissues was performed.

[Series 1: us soft tissue head/neck · 0.07mm/px · 13 of 57 slices shown]
[im 1/57]
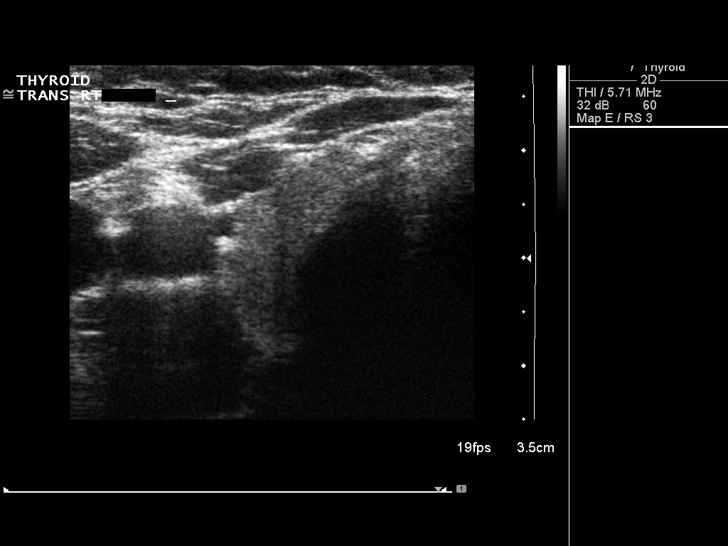
[im 5/57]
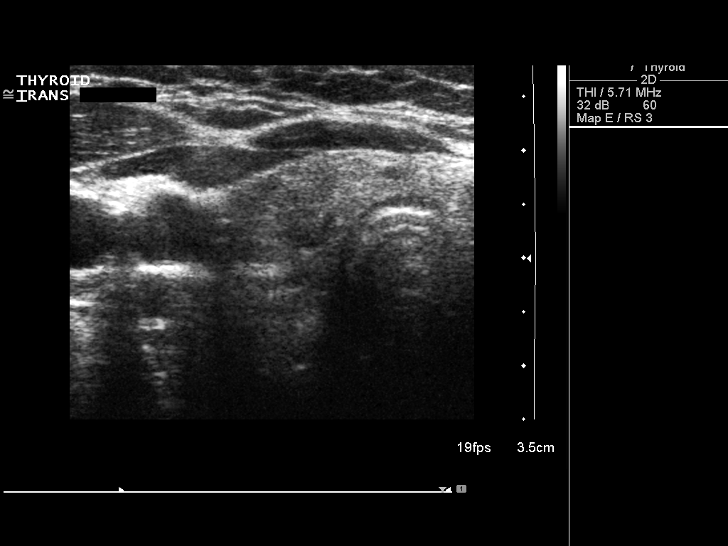
[im 10/57]
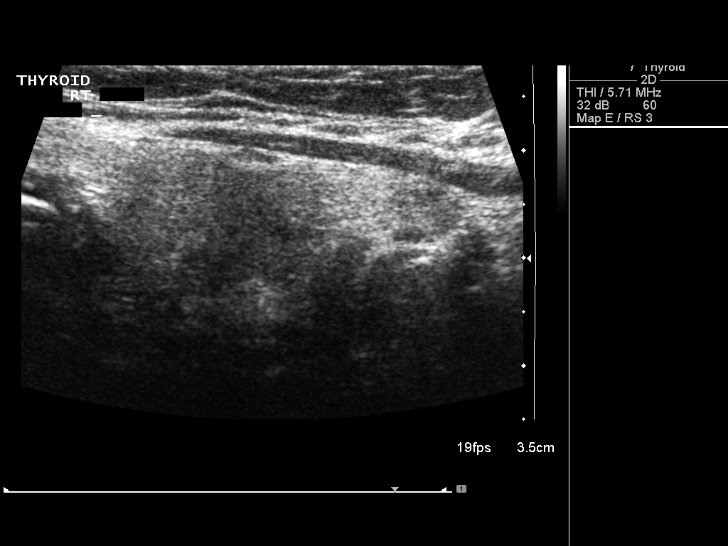
[im 15/57]
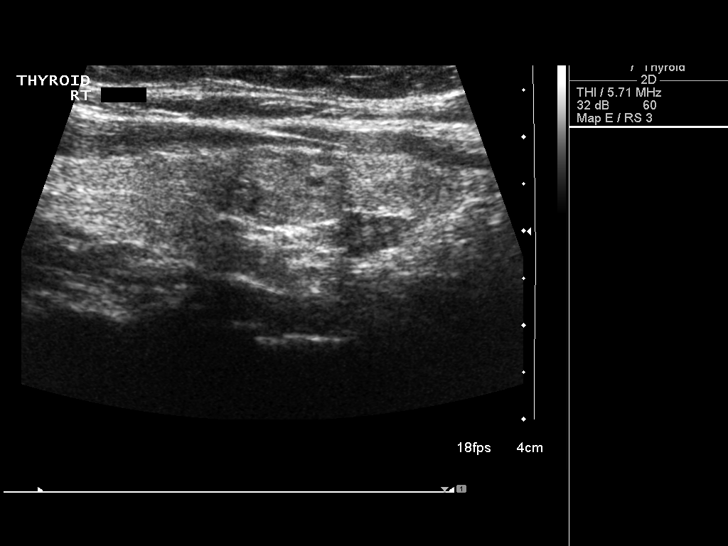
[im 19/57]
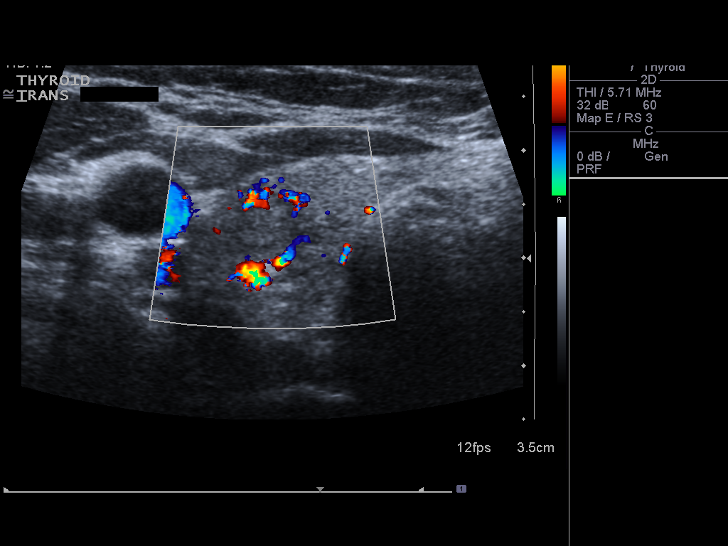
[im 24/57]
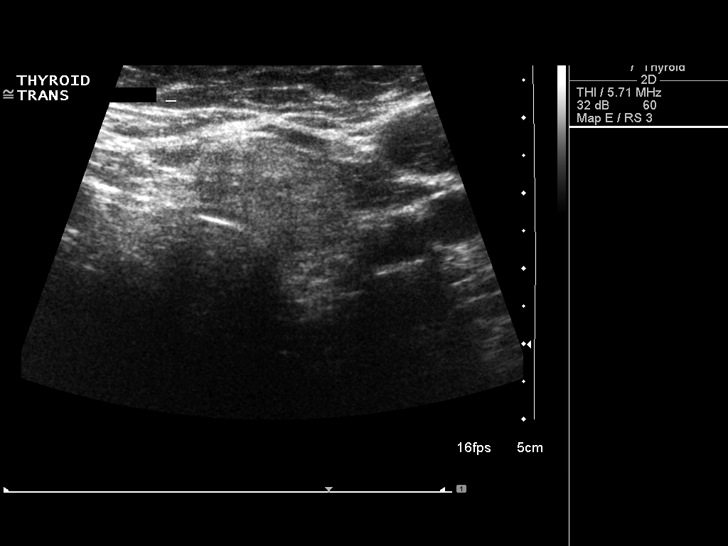
[im 29/57]
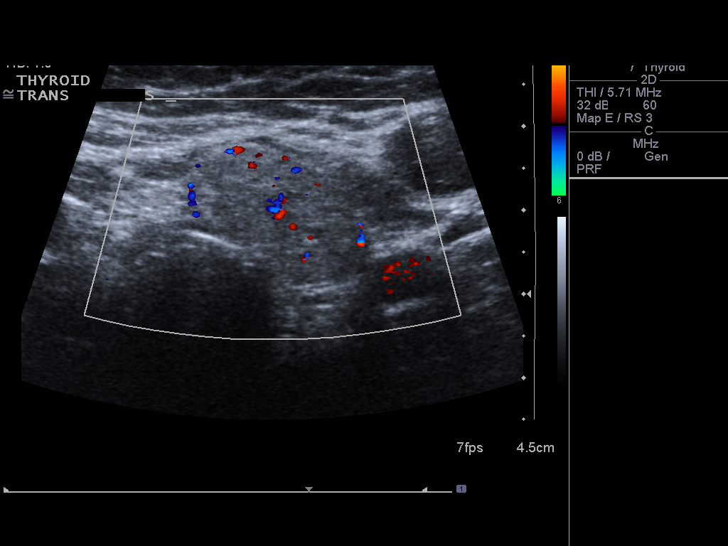
[im 33/57]
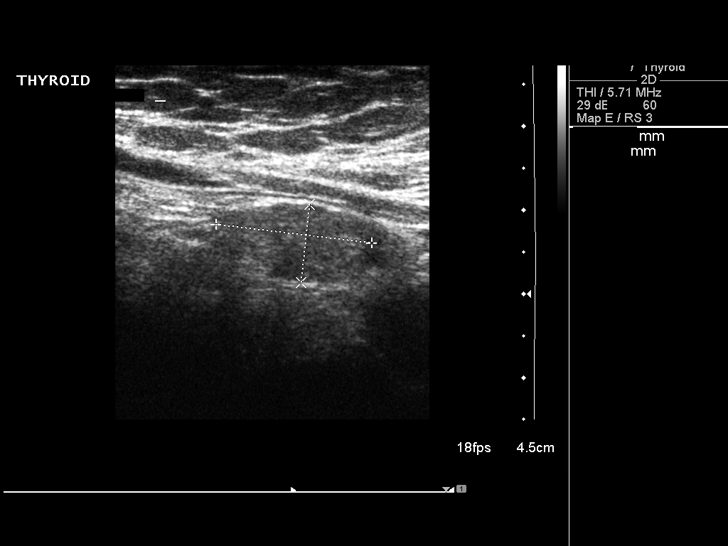
[im 38/57]
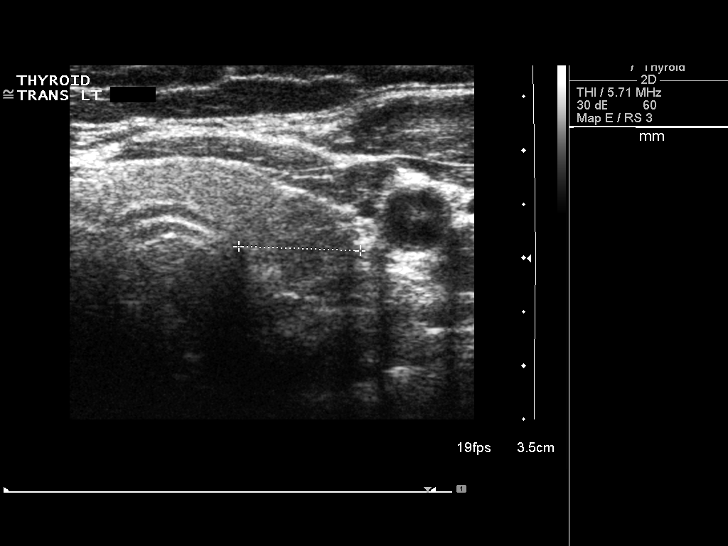
[im 43/57]
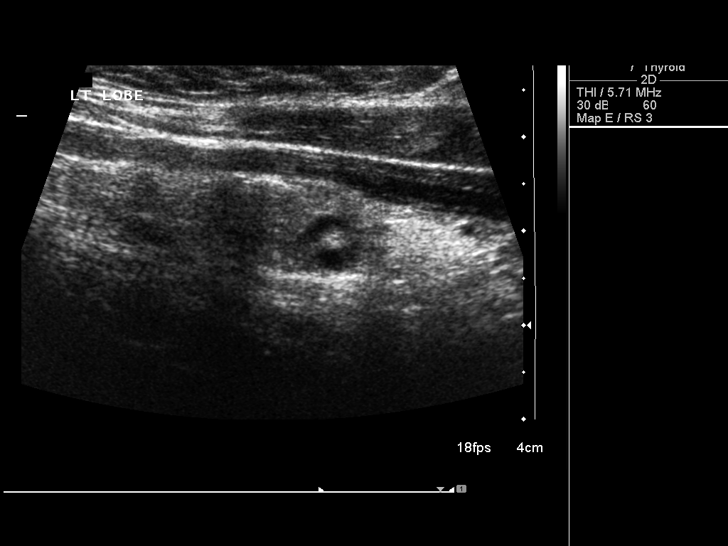
[im 47/57]
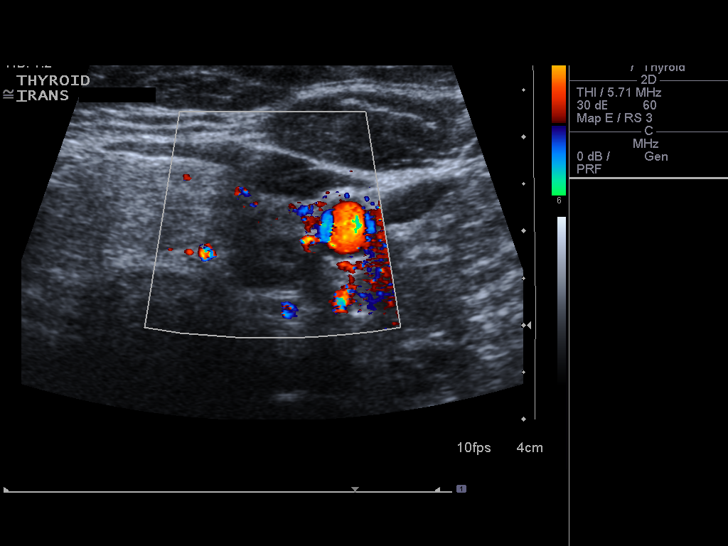
[im 52/57]
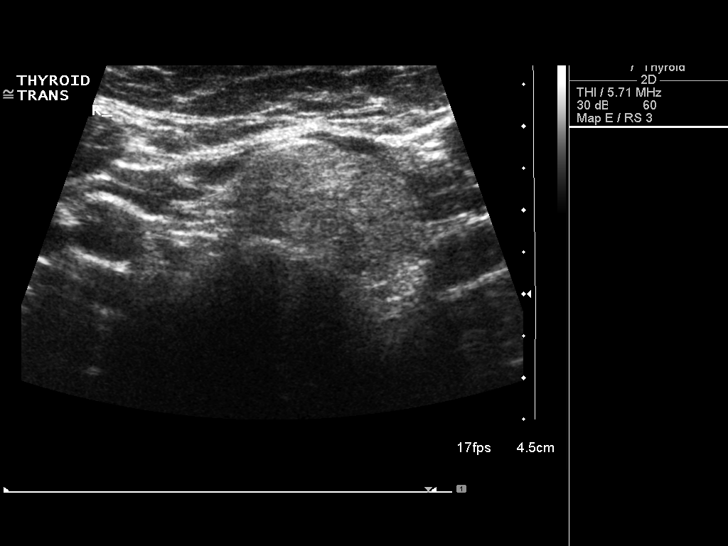
[im 57/57]
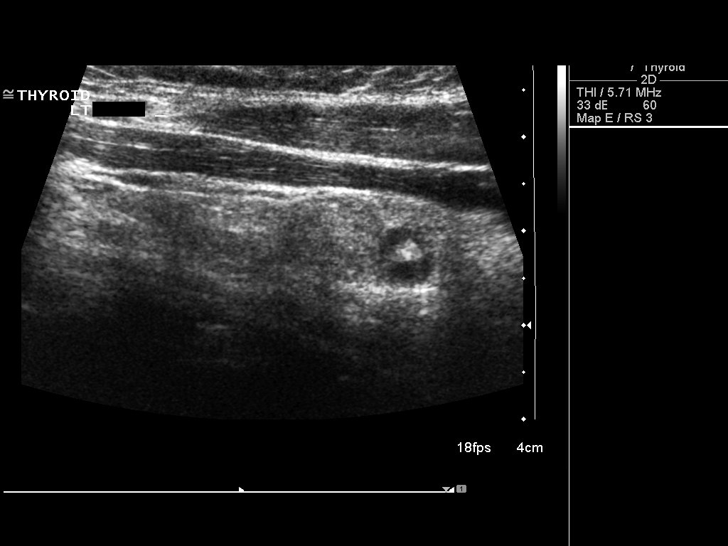

[13 of 25 positions shown; findings below may reference images not displayed]

FINDINGS: Right thyroid lobe

Measurements: 4.9 x 1.5 x 1.3 cm (stable size). Stable parenchymal
heterogeneity. Stable ovoid solid nodule in the inferior right lobe
measuring 1.1 x 0.8 x 0.9 cm.

Left thyroid lobe

Measurements: 4.3 x 1.1 x 1.1 cm (stable to slightly smaller size).
0.8 cm nodule in the inferior left lobe shows some increased
internal cystic degeneration since the prior study and is stable in
size.

Isthmus

Thickness: 0.3 cm. The dominant nodule at the juncture of the
isthmus and left lobe shows overall stable size when compared to
prior studies with current dimensions of approximately 2.2 x 1.1 x
2.0 cm. Previously noted additional 1.0 cm cystic nodule in the left
isthmus is no longer present and has involuted into heterogeneous
tissue.

Lymphadenopathy

None visualized.
IMPRESSION: Stable dominant nodule at the juncture of the left lobe and isthmus.
Previous cystic nodule in the left isthmus has involuted into
heterogeneous tissue. Right thyroid nodule is stable.

## 2016-02-04 DIAGNOSIS — R030 Elevated blood-pressure reading, without diagnosis of hypertension: Secondary | ICD-10-CM | POA: Insufficient documentation

## 2016-03-09 DIAGNOSIS — E669 Obesity, unspecified: Secondary | ICD-10-CM | POA: Insufficient documentation

## 2016-07-06 ENCOUNTER — Ambulatory Visit
Admission: RE | Admit: 2016-07-06 | Discharge: 2016-07-06 | Disposition: A | Discharge status: Home / Self Care | Payer: Managed Care, Other (non HMO) | Source: Ambulatory Visit | Attending: Endocrinology | Admitting: Endocrinology

## 2016-07-06 DIAGNOSIS — E041 Nontoxic single thyroid nodule: Secondary | ICD-10-CM

## 2016-07-24 ENCOUNTER — Other Ambulatory Visit: Payer: Self-pay | Admitting: Endocrinology

## 2016-07-24 DIAGNOSIS — E041 Nontoxic single thyroid nodule: Secondary | ICD-10-CM

## 2016-07-28 ENCOUNTER — Other Ambulatory Visit: Payer: Self-pay | Admitting: Endocrinology

## 2016-07-28 ENCOUNTER — Other Ambulatory Visit (HOSPITAL_COMMUNITY)
Admission: RE | Admit: 2016-07-28 | Discharge: 2016-07-28 | Disposition: A | Discharge status: Home / Self Care | Payer: Managed Care, Other (non HMO) | Source: Ambulatory Visit | Attending: Physician Assistant | Admitting: Physician Assistant

## 2016-07-28 ENCOUNTER — Ambulatory Visit
Admission: RE | Admit: 2016-07-28 | Discharge: 2016-07-28 | Disposition: A | Discharge status: Home / Self Care | Payer: Managed Care, Other (non HMO) | Source: Ambulatory Visit | Attending: Endocrinology | Admitting: Endocrinology

## 2016-07-28 DIAGNOSIS — E041 Nontoxic single thyroid nodule: Secondary | ICD-10-CM | POA: Diagnosis not present

## 2016-07-28 NOTE — Procedures (Addendum)
Using direct ultrasound guidance, 3 passes were made using needles into each of the nodules within the left lobe and the isthmus of the thyroid.   Ultrasound was used to confirm needle placements on all occasions.   Specimens were sent to Pathology for analysis.   Mikaeel Petrow S Taleigha Pinson PA-C 07/28/2016 2:56 PM

## 2016-09-02 ENCOUNTER — Ambulatory Visit: Payer: Self-pay | Admitting: Surgery

## 2016-10-13 ENCOUNTER — Encounter (HOSPITAL_COMMUNITY): Payer: Self-pay

## 2016-10-13 ENCOUNTER — Encounter (HOSPITAL_COMMUNITY)
Admission: RE | Admit: 2016-10-13 | Discharge: 2016-10-13 | Disposition: A | Discharge status: Home / Self Care | Payer: Managed Care, Other (non HMO) | Source: Ambulatory Visit | Attending: Surgery | Admitting: Surgery

## 2016-10-13 ENCOUNTER — Other Ambulatory Visit (HOSPITAL_COMMUNITY): Payer: Managed Care, Other (non HMO)

## 2016-10-13 DIAGNOSIS — Z01818 Encounter for other preprocedural examination: Secondary | ICD-10-CM | POA: Diagnosis not present

## 2016-10-13 HISTORY — DX: Gastro-esophageal reflux disease without esophagitis: K21.9

## 2016-10-13 HISTORY — DX: Other specified postprocedural states: Z98.890

## 2016-10-13 HISTORY — DX: Adverse effect of unspecified anesthetic, initial encounter: T41.45XA

## 2016-10-13 HISTORY — DX: Other complications of anesthesia, initial encounter: T88.59XA

## 2016-10-13 HISTORY — DX: Essential (primary) hypertension: I10

## 2016-10-13 HISTORY — DX: Other specified postprocedural states: R11.2

## 2016-10-13 HISTORY — DX: Hypothyroidism, unspecified: E03.9

## 2016-10-13 LAB — CBC
HCT: 39.8 % (ref 36.0–46.0)
Hemoglobin: 13.5 g/dL (ref 12.0–15.0)
MCH: 28.8 pg (ref 26.0–34.0)
MCHC: 33.9 g/dL (ref 30.0–36.0)
MCV: 85 fL (ref 78.0–100.0)
Platelets: 303 10*3/uL (ref 150–400)
RBC: 4.68 MIL/uL (ref 3.87–5.11)
RDW: 13.6 % (ref 11.5–15.5)
WBC: 11.6 10*3/uL — ABNORMAL HIGH (ref 4.0–10.5)

## 2016-10-13 LAB — HCG, SERUM, QUALITATIVE: Preg, Serum: NEGATIVE

## 2016-10-13 NOTE — Pre-Procedure Instructions (Addendum)
[place]  10/13/2016      [facility] at [place], [place] [address] Phone: [phone] Fax: [phone]    Your procedure is scheduled on 10/19/16  Report to [facility] Admitting at 530 A.M.  Call this number if you have problems the morning of surgery:  [phone]   Remember:  Do not eat food or drink liquids after midnight.  Take these medicines the morning of surgery with A SIP OF WATER      Synthroid,imitrex if needed  STOP all herbel meds, nsaids (aleve,naproxen,advil,ibuprofen)  Starting NOW including all vitamins/supplements,aspirin   Do not wear jewelry, make-up or nail polish.  Do not wear lotions, powders, or perfumes, or deoderant.  Do not shave 48 hours prior to surgery.  Men may shave face and neck.  Do not bring valuables to the hospital.  [facility] is not responsible for any belongings or valuables.  Contacts, dentures or bridgework may not be worn into surgery.  Leave your suitcase in the car.  After surgery it may be brought to your room.  For patients admitted to the hospital, discharge time will be determined by your treatment team.  Patients discharged the day of surgery will not be allowed to drive home.   Special instructions:   Special Instructions: [facility] - Preparing for Surgery  Before surgery, you can play an important role.  Because skin is not sterile, your skin needs to be as free of germs as possible.  You can reduce the number of germs on you skin by washing with CHG (chlorahexidine gluconate) soap before surgery.  CHG is an antiseptic cleaner which kills germs and bonds with the skin to continue killing germs even after washing.  Please DO NOT use if you have an allergy to CHG or antibacterial soaps.  If your skin becomes reddened/irritated stop using the CHG and inform your nurse when you arrive at Short Stay.  Do not shave (including  legs and underarms) for at least 48 hours prior to the first CHG shower.  You may shave your face.  Please follow these instructions carefully:   1.  Shower with CHG Soap the night before surgery and the morning of Surgery.  2.  If you choose to wash your hair, wash your hair first as usual with your normal shampoo.  3.  After you shampoo, rinse your hair and body thoroughly to remove the Shampoo.  4.  Use CHG as you would any other liquid soap.  You can apply chg directly  to the skin and wash gently with scrungie or a clean washcloth.  5.  Apply the CHG Soap to your body ONLY FROM THE NECK DOWN.  Do not use on open wounds or open sores.  Avoid contact with your eyes ears, mouth and genitals (private parts).  Wash genitals (private parts)       with your normal soap.  6.  Wash thoroughly, paying special attention to the area where your surgery will be performed.  7.  Thoroughly rinse your body with warm water from the neck down.  8.  DO NOT shower/wash with your normal soap after using and rinsing off the CHG Soap.  9.  Pat yourself dry with a clean towel.            10.  Wear clean pajamas.  11.  Place clean sheets on your bed the night of your first shower and do not sleep with pets.  Day of Surgery  Do not apply any lotions/deodorants the morning of surgery.  Please wear clean clothes to the hospital/surgery center.  Please read over the fact sheets that you were given.

## 2016-10-18 ENCOUNTER — Encounter (HOSPITAL_COMMUNITY): Payer: Self-pay | Admitting: Surgery

## 2016-10-18 DIAGNOSIS — D440 Neoplasm of uncertain behavior of thyroid gland: Secondary | ICD-10-CM | POA: Diagnosis present

## 2016-10-18 NOTE — H&P (Signed)
General Surgery [facility]  Angelica Lester DOB: July 15, 1971 Single / Language: Angelica Lester / Race: White Female   History of Present Illness  The patient is a 46 year old female who presents with a thyroid nodule.  Patient is referred by Dr. Gareth Lester for evaluation of multiple thyroid nodules and cytologic atypia. Patient has been followed for approximately 9 years with multiple thyroid nodules. Previous biopsy in 2008 was benign. Patient has been on thyroid hormone suppression and currently takes Synthroid 125 g daily. Patient recently underwent thyroid ultrasound showing a 12 mm nodule in the right lobe which was stable, a 26 mm nodule in the isthmus which had enlarged, and a 17 mm nodule in the left lobe which had also enlarged. Fine-needle aspiration biopsy was obtained of the 2 dominant nodules. The nodule in the isthmus was benign. 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 The patient is referred for surgical assessment and recommendations. Patient does have a family history of thyroid disease in her sister who underwent thyroidectomy. There is no family history of other endocrine neoplasms. Patient has had no prior head or neck surgery. She has been on thyroid hormone for approximately 9 years. Patient denies any significant compressive symptoms. She denies tremors or palpitations.   Past Surgical History  Gallbladder Surgery - Laparoscopic  Oral Surgery   Diagnostic Studies History  Colonoscopy  5-10 years ago Mammogram  within last year Pap Smear  1-5 years ago  Allergies  No Known Drug Allergies 09/02/2016  Medication History  Synthroid (125MCG Tablet, Oral daily) Active. HydroCHLOROthiazide (25MG  Tablet, Oral daily) Active. SUMAtriptan Succinate (50MG  Tablet, Oral as needed) Active. Medications Reconciled  Social History Alcohol use  Occasional alcohol use. Caffeine use  Carbonated  beverages, Tea. No drug use  Tobacco use  Never smoker.  Family History Arthritis  Mother. Breast Cancer  Sister. Colon Cancer  Family Members In General. Diabetes Mellitus  Family Members In General. Heart Disease  Sister. Heart disease in female family member before age 70  Hypertension  Mother. Melanoma  Sister. Migraine Headache  Sister. Rectal Cancer  Family Members In General. Respiratory Condition  Mother. Thyroid problems  Sister.  Pregnancy / Birth History  Age at menarche  61 years. Contraceptive History  Oral contraceptives. Gravida  1 Maternal age  79-35 Para  0 Regular periods   Other Problems Gastroesophageal Reflux Disease  High blood pressure  Migraine Headache   Review of Systems General Not Present- Appetite Loss, Chills, Fatigue, Fever, Night Sweats, Weight Gain and Weight Loss. Skin Not Present- Change in Wart/Mole, Dryness, Hives, Jaundice, New Lesions, Non-Healing Wounds, Rash and Ulcer. HEENT Present- Wears glasses/contact lenses. Not Present- Earache, Hearing Loss, Hoarseness, Nose Bleed, Oral Ulcers, Ringing in the Ears, Seasonal Allergies, Sinus Pain, Sore Throat, Visual Disturbances and Yellow Eyes. Respiratory Not Present- Bloody sputum, Chronic Cough, Difficulty Breathing, Snoring and Wheezing. Breast Not Present- Breast Mass, Breast Pain, Nipple Discharge and Skin Changes. Cardiovascular Not Present- Chest Pain, Difficulty Breathing Lying Down, Leg Cramps, Palpitations, Rapid Heart Rate, Shortness of Breath and Swelling of Extremities. Gastrointestinal Not Present- Abdominal Pain, Bloating, Bloody Stool, Change in Bowel Habits, Chronic diarrhea, Constipation, Difficulty Swallowing, Excessive gas, Gets full quickly at meals, Hemorrhoids, Indigestion, Nausea, Rectal Pain and Vomiting. Female Genitourinary Not Present- Frequency, Nocturia, Painful Urination, Pelvic Pain and Urgency. Musculoskeletal Not Present- Back Pain, Joint  Pain, Joint Stiffness, Muscle Pain, Muscle Weakness and Swelling of Extremities. Neurological Not Present-  Decreased Memory, Fainting, Headaches, Numbness, Seizures, Tingling, Tremor, Trouble walking and Weakness. Psychiatric Not Present- Anxiety, Bipolar, Change in Sleep Pattern, Depression, Fearful and Frequent crying. Endocrine Not Present- Cold Intolerance, Excessive Hunger, Hair Changes, Heat Intolerance, Hot flashes and New Diabetes. Hematology Not Present- Blood Thinners, Easy Bruising, Excessive bleeding, Gland problems, HIV and Persistent Infections.  Vitals Weight: 224.6 lb Height: 65in Body Surface Area: 2.08 m Body Mass Index: 37.38 kg/m  Temp.: 98.25F  Pulse: 82 (Regular)  BP: 122/82 (Sitting, Left Arm, Standard)   Physical Exam  The physical exam findings are as follows: Note:General - appears comfortable, no distress; not diaphorectic  HEENT - normocephalic; sclerae clear, gaze conjugate; mucous membranes moist, dentition good; voice normal  Neck - asymmetric on extension; no palpable anterior or posterior cervical adenopathy; palpable dominant nodule in the midline of the thyroid isthmus, approximately 3 cm in size, mobile, nontender; right lobe without palpable abnormality; left lobe with a soft nodule inferior pole, mobile with swallowing, nontender  Chest - clear bilaterally without rhonchi, rales, or wheeze  Cor - regular rhythm with normal rate; no significant murmur  Ext - non-tender without significant edema or lymphedema  Neuro - grossly intact; no tremor   Assessment & Plan  NEOPLASM OF UNCERTAIN BEHAVIOR OF THYROID GLAND (D44.0) MULTIPLE THYROID NODULES (E04.2)  Pt Education - Pamphlet Given - The Thyroid Book: discussed with patient and provided information.  Patient presents with bilateral thyroid nodules. Nodule in left lobe represents a thyroid neoplasm of uncertain behavior and has a risk of malignancy of approximately 25%. 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  Patient is provided with written literature on thyroid disease to review at home.  After review of records, physical examination, and discussion with the patient, I presented her with 3 options for management. First option would be to repeat the biopsy of the left thyroid lobe nodule and send it for molecular genetic testing. This would stratify her and either a high risk for Lester risk group. Second option would be to perform left thyroid lobectomy. Third option would be to perform total thyroidectomy. After discussion, the patient prefers to proceed with total thyroidectomy. We discussed risk and benefits of the procedure at length. We discussed the risk of recurrent laryngeal nerve injury and injury to parathyroid glands. We discussed the hospital stay to be anticipated. We discussed her postoperative recovery and return to work. We discussed the need for lifelong thyroid hormone replacement. We discussed the potential need for radioactive iodine treatment. She understands and wishes to proceed.  The risks and benefits of the procedure have been discussed at length with the patient. The patient understands the proposed procedure, potential alternative treatments, and the course of recovery to be expected. All of the patient's questions have been answered at this time. The patient wishes to proceed with surgery.  Earnstine Regal, MD, [facility] Office: [phone]

## 2016-10-19 ENCOUNTER — Encounter (HOSPITAL_COMMUNITY)
Admission: RE | Disposition: A | Discharge status: Home / Self Care | Payer: Self-pay | Source: Ambulatory Visit | Attending: Surgery

## 2016-10-19 ENCOUNTER — Ambulatory Visit (HOSPITAL_COMMUNITY): Payer: Managed Care, Other (non HMO)

## 2016-10-19 ENCOUNTER — Ambulatory Visit (HOSPITAL_COMMUNITY): Payer: Managed Care, Other (non HMO) | Admitting: Anesthesiology

## 2016-10-19 ENCOUNTER — Encounter (HOSPITAL_COMMUNITY): Payer: Self-pay | Admitting: Surgery

## 2016-10-19 ENCOUNTER — Observation Stay (HOSPITAL_COMMUNITY)
Admission: RE | Admit: 2016-10-19 | Discharge: 2016-10-20 | Disposition: A | Discharge status: Home / Self Care | Payer: Managed Care, Other (non HMO) | Source: Ambulatory Visit | Attending: Surgery | Admitting: Surgery

## 2016-10-19 DIAGNOSIS — K219 Gastro-esophageal reflux disease without esophagitis: Secondary | ICD-10-CM | POA: Diagnosis not present

## 2016-10-19 DIAGNOSIS — Z01818 Encounter for other preprocedural examination: Secondary | ICD-10-CM

## 2016-10-19 DIAGNOSIS — E042 Nontoxic multinodular goiter: Secondary | ICD-10-CM | POA: Diagnosis not present

## 2016-10-19 DIAGNOSIS — I10 Essential (primary) hypertension: Secondary | ICD-10-CM | POA: Diagnosis not present

## 2016-10-19 DIAGNOSIS — D34 Benign neoplasm of thyroid gland: Secondary | ICD-10-CM | POA: Insufficient documentation

## 2016-10-19 DIAGNOSIS — Z880 Allergy status to penicillin: Secondary | ICD-10-CM | POA: Insufficient documentation

## 2016-10-19 DIAGNOSIS — D440 Neoplasm of uncertain behavior of thyroid gland: Secondary | ICD-10-CM

## 2016-10-19 HISTORY — PX: THYROIDECTOMY: SHX17

## 2016-10-19 HISTORY — PX: TOTAL THYROIDECTOMY: SHX2547

## 2016-10-19 HISTORY — DX: Personal history of other endocrine, nutritional and metabolic disease: Z86.39

## 2016-10-19 HISTORY — DX: Unspecified malignant neoplasm of skin, unspecified: C44.90

## 2016-10-19 HISTORY — DX: Migraine, unspecified, not intractable, without status migrainosus: G43.909

## 2016-10-19 SURGERY — THYROIDECTOMY
Anesthesia: General | Site: Neck

## 2016-10-19 MED ORDER — CHLORHEXIDINE GLUCONATE CLOTH 2 % EX PADS: 6.0000 | MEDICATED_PAD | Freq: Once | CUTANEOUS | Status: DC

## 2016-10-19 MED ORDER — HYDROCODONE-ACETAMINOPHEN 5-325 MG PO TABS
ORAL_TABLET | ORAL | Status: AC
  Filled 2016-10-19: qty 2

## 2016-10-19 MED ORDER — ONDANSETRON HCL 4 MG/2ML IJ SOLN
INTRAMUSCULAR | Status: DC | PRN
  Administered 2016-10-19: 4 mg via INTRAVENOUS

## 2016-10-19 MED ORDER — MORPHINE SULFATE (PF) 2 MG/ML IV SOLN: 2.0000 mg | INTRAVENOUS | Status: DC | PRN

## 2016-10-19 MED ORDER — CEFAZOLIN SODIUM-DEXTROSE 2-4 GM/100ML-% IV SOLN
INTRAVENOUS | Status: AC
  Filled 2016-10-19: qty 100

## 2016-10-19 MED ORDER — ROCURONIUM BROMIDE 100 MG/10ML IV SOLN
INTRAVENOUS | Status: DC | PRN
  Administered 2016-10-19: 50 mg via INTRAVENOUS

## 2016-10-19 MED ORDER — HYDROCODONE-ACETAMINOPHEN 5-325 MG PO TABS
1.0000 | ORAL_TABLET | ORAL | Status: DC | PRN
  Administered 2016-10-19: 1 via ORAL
  Administered 2016-10-19: 2 via ORAL
  Administered 2016-10-20: 1 via ORAL
  Filled 2016-10-19 (×2): qty 1

## 2016-10-19 MED ORDER — SUGAMMADEX SODIUM 200 MG/2ML IV SOLN
INTRAVENOUS | Status: DC | PRN
  Administered 2016-10-19: 200 mg via INTRAVENOUS

## 2016-10-19 MED ORDER — PROPOFOL 10 MG/ML IV BOLUS
INTRAVENOUS | Status: DC | PRN
  Administered 2016-10-19: 150 mg via INTRAVENOUS

## 2016-10-19 MED ORDER — MIDAZOLAM HCL 2 MG/2ML IJ SOLN
INTRAMUSCULAR | Status: AC
  Filled 2016-10-19: qty 2

## 2016-10-19 MED ORDER — SUMATRIPTAN SUCCINATE 100 MG PO TABS
100.0000 mg | ORAL_TABLET | ORAL | Status: DC | PRN
  Filled 2016-10-19: qty 1

## 2016-10-19 MED ORDER — HYDROMORPHONE HCL 1 MG/ML IJ SOLN
0.2500 mg | INTRAMUSCULAR | Status: DC | PRN
  Administered 2016-10-19 (×3): 0.5 mg via INTRAVENOUS

## 2016-10-19 MED ORDER — FENTANYL CITRATE (PF) 100 MCG/2ML IJ SOLN
INTRAMUSCULAR | Status: DC | PRN
  Administered 2016-10-19 (×2): 100 ug via INTRAVENOUS

## 2016-10-19 MED ORDER — ACETAMINOPHEN 650 MG RE SUPP: 650.0000 mg | Freq: Four times a day (QID) | RECTAL | Status: DC | PRN

## 2016-10-19 MED ORDER — HYDROMORPHONE HCL 1 MG/ML IJ SOLN
INTRAMUSCULAR | Status: AC
  Filled 2016-10-19: qty 0.5

## 2016-10-19 MED ORDER — LIDOCAINE HCL (CARDIAC) 20 MG/ML IV SOLN
INTRAVENOUS | Status: DC | PRN
  Administered 2016-10-19: 100 mg via INTRATRACHEAL

## 2016-10-19 MED ORDER — DEXAMETHASONE SODIUM PHOSPHATE 10 MG/ML IJ SOLN
INTRAMUSCULAR | Status: DC | PRN
  Administered 2016-10-19: 5 mg via INTRAVENOUS

## 2016-10-19 MED ORDER — LABETALOL HCL 5 MG/ML IV SOLN
INTRAVENOUS | Status: DC | PRN
  Administered 2016-10-19: 10 mg via INTRAVENOUS

## 2016-10-19 MED ORDER — HYDROCHLOROTHIAZIDE 25 MG PO TABS
25.0000 mg | ORAL_TABLET | Freq: Every day | ORAL | Status: DC
  Administered 2016-10-19 – 2016-10-20 (×2): 25 mg via ORAL
  Filled 2016-10-19 (×2): qty 1

## 2016-10-19 MED ORDER — HYDROMORPHONE HCL 1 MG/ML IJ SOLN
INTRAMUSCULAR | Status: AC
  Filled 2016-10-19: qty 1

## 2016-10-19 MED ORDER — KCL IN DEXTROSE-NACL 20-5-0.45 MEQ/L-%-% IV SOLN
INTRAVENOUS | Status: DC
  Administered 2016-10-19: 12:00:00 via INTRAVENOUS
  Filled 2016-10-19: qty 1000

## 2016-10-19 MED ORDER — ACETAMINOPHEN 325 MG PO TABS
650.0000 mg | ORAL_TABLET | Freq: Four times a day (QID) | ORAL | Status: DC | PRN
  Administered 2016-10-20: 650 mg via ORAL
  Filled 2016-10-19: qty 2

## 2016-10-19 MED ORDER — ONDANSETRON HCL 4 MG/2ML IJ SOLN
4.0000 mg | Freq: Four times a day (QID) | INTRAMUSCULAR | Status: DC | PRN
  Administered 2016-10-19: 4 mg via INTRAVENOUS
  Filled 2016-10-19: qty 2

## 2016-10-19 MED ORDER — MEPERIDINE HCL 25 MG/ML IJ SOLN: 6.2500 mg | INTRAMUSCULAR | Status: DC | PRN

## 2016-10-19 MED ORDER — 0.9 % SODIUM CHLORIDE (POUR BTL) OPTIME
TOPICAL | Status: DC | PRN
  Administered 2016-10-19: 1000 mL

## 2016-10-19 MED ORDER — HYDROMORPHONE HCL 2 MG/ML IJ SOLN
1.0000 mg | INTRAMUSCULAR | Status: DC | PRN
  Administered 2016-10-19 (×2): 1 mg via INTRAVENOUS
  Filled 2016-10-19 (×2): qty 1

## 2016-10-19 MED ORDER — ONDANSETRON 4 MG PO TBDP: 4.0000 mg | ORAL_TABLET | Freq: Four times a day (QID) | ORAL | Status: DC | PRN

## 2016-10-19 MED ORDER — MIDAZOLAM HCL 5 MG/5ML IJ SOLN
INTRAMUSCULAR | Status: DC | PRN
  Administered 2016-10-19: 2 mg via INTRAVENOUS

## 2016-10-19 MED ORDER — FENTANYL CITRATE (PF) 100 MCG/2ML IJ SOLN
INTRAMUSCULAR | Status: AC
  Filled 2016-10-19: qty 4

## 2016-10-19 MED ORDER — LACTATED RINGERS IV SOLN
INTRAVENOUS | Status: DC | PRN
  Administered 2016-10-19 (×2): via INTRAVENOUS

## 2016-10-19 MED ORDER — PROMETHAZINE HCL 25 MG/ML IJ SOLN
12.5000 mg | INTRAMUSCULAR | Status: DC | PRN
  Administered 2016-10-19 – 2016-10-20 (×3): 12.5 mg via INTRAVENOUS
  Filled 2016-10-19 (×3): qty 1

## 2016-10-19 MED ORDER — HEMOSTATIC AGENTS (NO CHARGE) OPTIME
TOPICAL | Status: DC | PRN
  Administered 2016-10-19: 1 via TOPICAL

## 2016-10-19 MED ORDER — ONDANSETRON HCL 4 MG/2ML IJ SOLN
INTRAMUSCULAR | Status: AC
  Filled 2016-10-19: qty 2

## 2016-10-19 MED ORDER — CALCIUM CARBONATE 1250 (500 CA) MG PO TABS
2.0000 | ORAL_TABLET | Freq: Three times a day (TID) | ORAL | Status: DC
  Administered 2016-10-19 – 2016-10-20 (×4): 1000 mg via ORAL
  Filled 2016-10-19 (×4): qty 1

## 2016-10-19 MED ORDER — PROPOFOL 10 MG/ML IV BOLUS
INTRAVENOUS | Status: AC
  Filled 2016-10-19: qty 20

## 2016-10-19 MED ORDER — LEVOTHYROXINE SODIUM 25 MCG PO TABS
125.0000 ug | ORAL_TABLET | Freq: Every day | ORAL | Status: DC
  Administered 2016-10-20: 125 ug via ORAL
  Filled 2016-10-19: qty 1

## 2016-10-19 MED ORDER — ONDANSETRON HCL 4 MG/2ML IJ SOLN
4.0000 mg | Freq: Once | INTRAMUSCULAR | Status: AC | PRN
Start: 2016-10-19 — End: 2016-10-19
  Administered 2016-10-19: 4 mg via INTRAVENOUS

## 2016-10-19 MED ORDER — CEFAZOLIN SODIUM-DEXTROSE 2-4 GM/100ML-% IV SOLN
2.0000 g | INTRAVENOUS | Status: AC
  Administered 2016-10-19: 2 g via INTRAVENOUS

## 2016-10-19 SURGICAL SUPPLY — 44 items
ATTRACTOMAT 16X20 MAGNETIC DRP (DRAPES) ×2 IMPLANT
BLADE SURG 10 STRL SS (BLADE) ×2 IMPLANT
BLADE SURG 15 STRL LF DISP TIS (BLADE) ×1 IMPLANT
BLADE SURG 15 STRL SS (BLADE) ×2
BLADE SURG ROTATE 9660 (MISCELLANEOUS) IMPLANT
CANISTER SUCTION 2500CC (MISCELLANEOUS) ×2 IMPLANT
CHLORAPREP W/TINT 10.5 ML (MISCELLANEOUS) ×2 IMPLANT
CLIP TI MEDIUM 24 (CLIP) ×2 IMPLANT
CLIP TI WIDE RED SMALL 24 (CLIP) ×2 IMPLANT
COVER SURGICAL LIGHT HANDLE (MISCELLANEOUS) ×2 IMPLANT
CRADLE DONUT ADULT HEAD (MISCELLANEOUS) ×2 IMPLANT
DRAPE LAPAROTOMY 100X72 PEDS (DRAPES) ×2 IMPLANT
DRAPE UTILITY XL STRL (DRAPES) ×2 IMPLANT
ELECT CAUTERY BLADE 6.4 (BLADE) ×2 IMPLANT
ELECT REM PT RETURN 9FT ADLT (ELECTROSURGICAL) ×2
ELECTRODE REM PT RTRN 9FT ADLT (ELECTROSURGICAL) ×1 IMPLANT
GAUZE SPONGE 4X4 12PLY STRL (GAUZE/BANDAGES/DRESSINGS) ×2 IMPLANT
GAUZE SPONGE 4X4 16PLY XRAY LF (GAUZE/BANDAGES/DRESSINGS) ×2 IMPLANT
GLOVE SURG ORTHO 8.0 STRL STRW (GLOVE) ×2 IMPLANT
GOWN STRL REUS W/ TWL LRG LVL3 (GOWN DISPOSABLE) ×1 IMPLANT
GOWN STRL REUS W/ TWL XL LVL3 (GOWN DISPOSABLE) ×1 IMPLANT
GOWN STRL REUS W/TWL LRG LVL3 (GOWN DISPOSABLE) ×2
GOWN STRL REUS W/TWL XL LVL3 (GOWN DISPOSABLE) ×2
HEMOSTAT SURGICEL 2X4 FIBR (HEMOSTASIS) ×2 IMPLANT
ILLUMINATOR WAVEGUIDE N/F (MISCELLANEOUS) ×1 IMPLANT
KIT BASIN OR (CUSTOM PROCEDURE TRAY) ×2 IMPLANT
KIT ROOM TURNOVER OR (KITS) ×2 IMPLANT
LIGHT WAVEGUIDE WIDE FLAT (MISCELLANEOUS) IMPLANT
NS IRRIG 1000ML POUR BTL (IV SOLUTION) ×2 IMPLANT
PACK SURGICAL SETUP 50X90 (CUSTOM PROCEDURE TRAY) ×2 IMPLANT
PAD ARMBOARD 7.5X6 YLW CONV (MISCELLANEOUS) ×2 IMPLANT
PENCIL BUTTON HOLSTER BLD 10FT (ELECTRODE) ×2 IMPLANT
SHEARS HARMONIC 9CM CVD (BLADE) ×2 IMPLANT
SPECIMEN JAR MEDIUM (MISCELLANEOUS) ×2 IMPLANT
SPONGE INTESTINAL PEANUT (DISPOSABLE) ×2 IMPLANT
STRIP CLOSURE SKIN 1/2X4 (GAUZE/BANDAGES/DRESSINGS) ×2 IMPLANT
SUT MNCRL AB 4-0 PS2 18 (SUTURE) ×2 IMPLANT
SUT SILK 2 0 (SUTURE) ×2
SUT SILK 2-0 18XBRD TIE 12 (SUTURE) ×1 IMPLANT
SUT VIC AB 3-0 SH 18 (SUTURE) ×4 IMPLANT
SYR BULB 3OZ (MISCELLANEOUS) ×2 IMPLANT
TOWEL OR 17X24 6PK STRL BLUE (TOWEL DISPOSABLE) ×1 IMPLANT
TOWEL OR 17X26 10 PK STRL BLUE (TOWEL DISPOSABLE) ×2 IMPLANT
TUBE CONNECTING 12X1/4 (SUCTIONS) ×2 IMPLANT

## 2016-10-19 NOTE — Anesthesia Postprocedure Evaluation (Addendum)
Anesthesia Post Note  Patient: Angelica Lester  Procedure(s) Performed: Procedure(s) (LRB): TOTAL THYROIDECTOMY (N/A)  Patient location during evaluation: PACU Anesthesia Type: General Level of consciousness: sedated Pain management: pain level controlled Vital Signs Assessment: post-procedure vital signs reviewed and stable Respiratory status: spontaneous breathing and respiratory function stable Cardiovascular status: stable Anesthetic complications: no       Last Vitals:  Vitals:   10/19/16 0945 10/19/16 1000  BP: (!) 145/86 (!) 155/96  Pulse: 70 65  Resp: 13 15  Temp:      Last Pain:  Vitals:   10/19/16 1015  TempSrc:   PainSc: 6                  Starlyn Droge DANIEL

## 2016-10-19 NOTE — Op Note (Signed)
Procedure Note  Pre-operative Diagnosis:  Thyroid neoplasm of uncertain behavior, multiple thyroid nodules  Post-operative Diagnosis:  same  Surgeon:  Earnstine Regal, MD, FACS  Assistant:  Ralene Ok, MD   Procedure:  Total thyroidectomy  Anesthesia:  General  Estimated Blood Loss:  minimal  Drains: none         Specimen: thyroid to pathology  Indications:  The patient is a 46 year old female who presents with a thyroid nodule.  Patient is referred by Dr. Gareth Eagle for evaluation of multiple thyroid nodules and cytologic atypia. Patient has been followed for approximately 9 years with multiple thyroid nodules. Previous biopsy in 2008 was benign. Patient has been on thyroid hormone suppression and currently takes Synthroid 125 g daily. Patient recently underwent thyroid ultrasound showing a 12 mm nodule in the right lobe which was stable, a 26 mm nodule in the isthmus which had enlarged, and a 17 mm nodule in the left lobe which had also enlarged. Fine-needle aspiration biopsy was obtained of the 2 dominant nodules. The nodule in the isthmus was benign. 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 The patient is referred for surgical assessment and recommendations.   Procedure Details: Procedure was done in OR #1 at the [facility].  The patient was brought to the operating room and placed in a supine position on the operating room table.  Following administration of general anesthesia, the patient was positioned and then prepped and draped in the usual aseptic fashion.  After ascertaining that an adequate level of anesthesia had been achieved, a Kocher incision was made with #15 blade.  Dissection was carried through subcutaneous tissues and platysma. Hemostasis was achieved with the electrocautery.  Skin flaps were elevated cephalad and caudad from the thyroid notch to the sternal notch.  The Mahorner self-retaining retractor was  placed for exposure.  Strap muscles were incised in the midline and dissection was begun on the left side.  Strap muscles were reflected laterally.  Left thyroid lobe was small.  The left lobe was gently mobilized with blunt dissection.  Superior pole vessels were dissected out and divided individually between small and medium Ligaclips with the Harmonic scalpel.  The thyroid lobe was rolled anteriorly.  Branches of the inferior thyroid artery were divided between small Ligaclips with the Harmonic scalpel.  Inferior venous tributaries were divided between Ligaclips.  Both the superior and inferior parathyroid glands were identified and preserved on their vascular pedicles.  The recurrent laryngeal nerve was identified and preserved along its course.  The ligament of Gwenlyn Found was released with the electrocautery and the gland was mobilized onto the anterior trachea. Isthmus was mobilized across the midline.  There was a moderate sized nodule in the isthmus.  A small pyramidal lobe present.  Dry pack was placed in the left neck.  Next, the right thyroid lobe was gently mobilized with blunt dissection.  Right thyroid lobe was normal in size with small nodules present.  Superior pole vessels were dissected out and divided between small and medium Ligaclips with the Harmonic scalpel.  Superior parathyroid was identified and preserved.  Inferior venous tributaries were divided between medium Ligaclips with the Harmonic scalpel.  The right thyroid lobe was rolled anteriorly and the branches of the inferior thyroid artery divided between small Ligaclips.  The right recurrent laryngeal nerve was identified and preserved along its course.  The ligament of Gwenlyn Found was released with the electrocautery.  The right  thyroid lobe was mobilized onto the anterior trachea and the remainder of the thyroid was dissected off the anterior trachea and the thyroid was completely excised.  A suture was used to mark the right lobe. The entire  thyroid gland was submitted to pathology for review.  The neck was irrigated with warm saline.  Fibrillar was placed throughout the operative field.  Strap muscles were reapproximated in the midline with interrupted 3-0 Vicryl sutures.  Platysma was closed with interrupted 3-0 Vicryl sutures.  Skin was closed with a running 4-0 Monocryl subcuticular suture.  Wound was washed and dried and steri-strips were applied.  Dry gauze dressing was placed.  The patient was awakened from anesthesia and brought to the recovery room.  The patient tolerated the procedure well.   Earnstine Regal, MD, [facility] Office: [phone]

## 2016-10-19 NOTE — Transfer of Care (Signed)
Immediate Anesthesia Transfer of Care Note  Patient: Angelica Lester  Procedure(s) Performed: Procedure(s): TOTAL THYROIDECTOMY (N/A)  Patient Location: PACU  Anesthesia Type:General  Level of Consciousness: awake, alert  and oriented  Airway & Oxygen Therapy: Patient Spontanous Breathing and Patient connected to nasal cannula oxygen  Post-op Assessment: Report given to RN, Post -op Vital signs reviewed and stable and Patient moving all extremities X 4  Post vital signs: Reviewed and stable  Last Vitals:  Vitals:   10/19/16 0613 10/19/16 0915  BP: (!) 157/88   Pulse: 76   Resp: 20   Temp: 36.6 C 36.6 C    Last Pain:  Vitals:   10/19/16 0915  TempSrc:   PainSc: 8       Patients Stated Pain Goal: 6 ([id number])  Complications: No apparent anesthesia complications

## 2016-10-19 NOTE — Anesthesia Preprocedure Evaluation (Signed)
Anesthesia Evaluation  Patient identified by MRN, date of birth, ID band Patient awake    Reviewed: Allergy & Precautions, NPO status , Patient's Chart, lab work & pertinent test results  History of Anesthesia Complications (+) PONV  Airway Mallampati: I  TM Distance: >3 FB Neck ROM: Full    Dental   Pulmonary    Pulmonary exam normal        Cardiovascular hypertension, Pt. on medications Normal cardiovascular exam     Neuro/Psych    GI/Hepatic GERD  Medicated and Controlled,  Endo/Other    Renal/GU      Musculoskeletal   Abdominal   Peds  Hematology   Anesthesia Other Findings   Reproductive/Obstetrics                             Anesthesia Physical Anesthesia Plan  ASA: II  Anesthesia Plan: General   Post-op Pain Management:    Induction: Intravenous  Airway Management Planned: LMA  Additional Equipment:   Intra-op Plan:   Post-operative Plan: Extubation in OR  Informed Consent: I have reviewed the patients History and Physical, chart, labs and discussed the procedure including the risks, benefits and alternatives for the proposed anesthesia with the patient or authorized representative who has indicated his/her understanding and acceptance.     Plan Discussed with: CRNA and Surgeon  Anesthesia Plan Comments:         Anesthesia Quick Evaluation  

## 2016-10-19 NOTE — Progress Notes (Signed)
Patient arrived from PACU  Alert and oriented, VSS,  Gauze dressing to midline neck. Patient slightly drowsy, mild pain, SCDs on, IV intact left hand. No complaints at moment, family in route, will continue to monitor.

## 2016-10-19 NOTE — Anesthesia Procedure Notes (Signed)
Procedure Name: Intubation Date/Time: 10/19/2016 7:38 AM Performed by: Mariea Clonts Pre-anesthesia Checklist: Patient identified, Emergency Drugs available, Suction available and Patient being monitored Patient Re-evaluated:Patient Re-evaluated prior to inductionOxygen Delivery Method: Circle System Utilized Preoxygenation: Pre-oxygenation with 100% oxygen Intubation Type: IV induction Ventilation: Mask ventilation without difficulty Laryngoscope Size: Miller and 2 Grade View: Grade I Tube type: Oral Number of attempts: 1 Airway Equipment and Method: Stylet and Oral airway Placement Confirmation: ETT inserted through vocal cords under direct vision,  positive ETCO2 and breath sounds checked- equal and bilateral Tube secured with: Tape Dental Injury: Teeth and Oropharynx as per pre-operative assessment

## 2016-10-19 NOTE — Interval H&P Note (Signed)
History and Physical Interval Note:  10/19/2016 7:10 AM  Angelica Lester  has presented today for surgery, with the diagnosis of THYROID NEOPLASM OF THE UNCERTAIN BEHAVIOR.  The various methods of treatment have been discussed with the patient and family. After consideration of risks, benefits and other options for treatment, the patient has consented to    Procedure(s): TOTAL THYROIDECTOMY (N/A) as a surgical intervention .    The patient's history has been reviewed, patient examined, no change in status, stable for surgery.  I have reviewed the patient's chart and labs.  Questions were answered to the patient's satisfaction.    Earnstine Regal, MD, [facility] Office: [place]

## 2016-10-20 ENCOUNTER — Encounter (HOSPITAL_COMMUNITY): Payer: Self-pay | Admitting: Surgery

## 2016-10-20 DIAGNOSIS — E042 Nontoxic multinodular goiter: Secondary | ICD-10-CM | POA: Diagnosis not present

## 2016-10-20 LAB — BASIC METABOLIC PANEL
Anion gap: 12 (ref 5–15)
BUN: <5 mg/dL — ABNORMAL LOW (ref 6–20)
CO2: 29 mmol/L (ref 22–32)
Calcium: 9.1 mg/dL (ref 8.9–10.3)
Chloride: 91 mmol/L — ABNORMAL LOW (ref 101–111)
Creatinine, Ser: 0.77 mg/dL (ref 0.44–1.00)
GFR calc Af Amer: >60 mL/min (ref >60)
GFR calc non Af Amer: >60 mL/min (ref >60)
Glucose, Bld: 124 mg/dL — ABNORMAL HIGH (ref 65–99)
Potassium: 2.7 mmol/L — CL (ref 3.5–5.1)
Sodium: 132 mmol/L — ABNORMAL LOW (ref 135–145)

## 2016-10-20 MED ORDER — PROMETHAZINE HCL 25 MG PO TABS: 25.0000 mg | ORAL_TABLET | Freq: Four times a day (QID) | ORAL | 0 refills | Status: DC | PRN

## 2016-10-20 MED ORDER — POTASSIUM CHLORIDE CRYS ER 10 MEQ PO TBCR
10.0000 meq | EXTENDED_RELEASE_TABLET | Freq: Once | ORAL | Status: AC
  Administered 2016-10-20: 10 meq via ORAL
  Filled 2016-10-20: qty 1

## 2016-10-20 MED ORDER — HYDROCODONE-ACETAMINOPHEN 5-325 MG PO TABS: 1.0000 | ORAL_TABLET | ORAL | 0 refills | Status: DC | PRN

## 2016-10-20 MED ORDER — CALCIUM CARBONATE ANTACID 500 MG PO CHEW: 2.0000 | CHEWABLE_TABLET | Freq: Two times a day (BID) | ORAL | 1 refills | Status: DC

## 2016-10-20 NOTE — Progress Notes (Signed)
CRITICAL VALUE ALERT  Critical value received:  K+ - 2.7  Date of notification:  10/20/2016  Time of notification: [id number]    Critical value read back:Yes.    Nurse who received alert:  Leanord Hawking, RN  MD notified (1st page):  Dr. Harlow Asa arrived on floor  Time of first page: [phone]  MD notified (2nd page):  Time of second page:  Responding MD:  Dr. Harlow Asa  Time MD responded:  [phone]

## 2016-10-20 NOTE — Care Management Note (Signed)
Case Management Note  Patient Details  Name: Angelica Lester MRN: [id number] Date of Birth: 1970/11/29  Subjective/Objective:                    Action/Plan:   Expected Discharge Date:  10/20/16               Expected Discharge Plan:  Home/Self Care  In-House Referral:     Discharge planning Services     Post Acute Care Choice:    Choice offered to:     DME Arranged:    DME Agency:     HH Arranged:    [place] Agency:     Status of Service:  Completed, signed off  If discussed at [employer] of Stay Meetings, dates discussed:    Additional Comments:  Marilu Favre, RN 10/20/2016, 10:39 AM

## 2016-10-20 NOTE — Discharge Summary (Signed)
Physician Discharge Summary [facility]  Patient ID: Angelica Lester MRN: [id number] DOB/AGE: 1971-05-09 46 y.o.  Admit date: 10/19/2016 Discharge date: 10/20/2016  Admission Diagnoses:  Thyroid neoplasm of uncertain behavior, multiple thyroid nodules  Discharge Diagnoses:  Principal Problem:   Neoplasm of uncertain behavior of thyroid gland   Discharged Condition: good  Hospital Course: Patient was admitted for observation following thyroid surgery.  Post op course was uncomplicated.  Pain was well controlled.  Tolerated diet.  Post op calcium level on morning following surgery was 9.1 mg/dl.  Patient was prepared for discharge home on POD#1.  Consults: None  Treatments: surgery: total thyroidectomy  Discharge Exam: Blood pressure 131/75, pulse 63, temperature 97.9 F (36.6 C), temperature source Oral, resp. rate 16, weight 100.9 kg (222 lb 7 oz), last menstrual period 08/21/2016, SpO2 94 %. HEENT - clear Neck - wound dry and intact; mild STS; mild hoarseness Chest - clear bilaterally Cor - RRR  Disposition: Home  Discharge Instructions    Diet - low sodium heart healthy    Complete by:  As directed    Discharge instructions    Complete by:  As directed    [facility]  THYROID & PARATHYROID SURGERY:  POST-OP INSTRUCTIONS  Always review your discharge instruction sheet from the facility where your surgery was performed.  A prescription for pain medication may be given to you upon discharge.  Take your pain medication as prescribed.  If narcotic pain medicine is not needed, then you may take acetaminophen (Tylenol) or ibuprofen (Advil) as needed.  Take your usually prescribed medications unless otherwise directed.  If you need a refill on your pain medication, please contact our office during regular business hours.  Prescriptions will not be processed by our office after 5 pm or on weekends.  Start with a light diet upon arrival  home, such as soup and crackers or toast.  Be sure to drink plenty of fluids daily.  Resume your normal diet the day after surgery.  Most patients will experience some swelling and bruising on the chest and neck area.  Ice packs will help.  Swelling and bruising can take several days to resolve.   It is common to experience some constipation after surgery.  Increasing fluid intake and taking a stool softener (Colace) will usually help or prevent this problem.  A mild laxative (Milk of Magnesia or Miralax) should be taken according to package directions if there has been no bowel movement after 48 hours.  You have steri-strips and a gauze dressing over your incision.  You may remove the gauze bandage on the second day after surgery, and you may shower at that time.  Leave your steri-strips (small skin tapes) in place directly over the incision.  These strips should remain on the skin for 5-7 days and then be removed.  You may get them wet in the shower and pat them dry.  You may resume regular (light) daily activities beginning the next day - such as daily self-care, walking, climbing stairs - gradually increasing activities as tolerated.  You may have sexual intercourse when it is comfortable.  Refrain from any heavy lifting or straining until approved by your doctor.  You may drive when you no longer are taking prescription pain medication, you can comfortably wear a seatbelt, and you can safely maneuver your car and apply brakes.  You should see your doctor in the office for a follow-up appointment approximately three weeks after your  surgery.  Make sure that you call for this appointment within a day or two after you arrive home to insure a convenient appointment time.  WHEN TO CALL YOUR DOCTOR: -- Fever greater than 101.5 -- Inability to urinate -- Nausea and/or vomiting - persistent -- Extreme swelling or bruising -- Continued bleeding from incision -- Increased pain, redness, or drainage from  the incision -- Difficulty swallowing or breathing -- Muscle cramping or spasms -- Numbness or tingling in hands or around lips  The clinic staff is available to answer your questions during regular business hours.  Please don't hesitate to call and ask to speak to one of the nurses if you have concerns.  Earnstine Regal, MD, [facility] Office: [phone]  Website: [email or web address]   Increase activity slowly    Complete by:  As directed    Remove dressing in 24 hours    Complete by:  As directed      Allergies as of 10/20/2016      Reactions   Amoxicillin Nausea Only      Medication List    TAKE these medications   calcium carbonate 500 MG chewable tablet Commonly known as:  TUMS Chew 2 tablets (400 mg of elemental calcium total) by mouth 2 (two) times daily.   hydrochlorothiazide 25 MG tablet Commonly known as:  HYDRODIURIL Take 25 mg by mouth daily.   HYDROcodone-acetaminophen 5-325 MG tablet Commonly known as:  NORCO/VICODIN Take 1-2 tablets by mouth every 4 (four) hours as needed for moderate pain.   ibuprofen 200 MG tablet Commonly known as:  ADVIL,MOTRIN Take 400 mg by mouth every 6 (six) hours as needed for headache, mild pain or moderate pain.   levothyroxine 125 MCG tablet Commonly known as:  SYNTHROID, LEVOTHROID Take 125 mcg by mouth daily before breakfast.   multivitamin with minerals tablet Take 1 tablet by mouth daily.   SUMAtriptan 100 MG tablet Commonly known as:  IMITREX Take 100 mg by mouth every 2 (two) hours as needed for migraine. May repeat in 2 hours if headache persists or recurs.      Follow-up Information    Anahi Belmar M, MD. Schedule an appointment as soon as possible for a visit in 3 weeks.   Specialty:  General Surgery Why:  For wound re-check Contact information: [place] [address] [phone]           Earnstine Regal,  MD, [facility] Office: [phone]   Signed: Earnstine Regal 10/20/2016, 8:08 AM

## 2016-10-21 NOTE — Progress Notes (Signed)
Please contact patient and notify of benign pathology results.  Parrish Bonn M. Makaiya Geerdes, MD, FACS [facility] Office: [phone]   

## 2017-01-04 LAB — HM MAMMOGRAPHY

## 2017-02-06 NOTE — Addendum Note (Signed)
Addendum  created 02/06/17 0933 by Duane Boston, MD   Sign clinical note

## 2017-03-12 DIAGNOSIS — I10 Essential (primary) hypertension: Secondary | ICD-10-CM | POA: Insufficient documentation

## 2017-03-12 DIAGNOSIS — E890 Postprocedural hypothyroidism: Secondary | ICD-10-CM | POA: Insufficient documentation

## 2017-03-12 DIAGNOSIS — N951 Menopausal and female climacteric states: Secondary | ICD-10-CM | POA: Insufficient documentation

## 2017-09-30 ENCOUNTER — Encounter: Payer: Self-pay | Admitting: Family Medicine

## 2017-10-01 ENCOUNTER — Ambulatory Visit: Payer: Managed Care, Other (non HMO) | Admitting: Family Medicine

## 2017-10-01 ENCOUNTER — Encounter: Payer: Self-pay | Admitting: Family Medicine

## 2017-10-01 VITALS — BP 122/84 | HR 84 | Temp 98.3°F | Ht 64.25 in | Wt 222.4 lb

## 2017-10-01 DIAGNOSIS — E669 Obesity, unspecified: Secondary | ICD-10-CM

## 2017-10-01 DIAGNOSIS — E890 Postprocedural hypothyroidism: Secondary | ICD-10-CM | POA: Diagnosis not present

## 2017-10-01 DIAGNOSIS — Z1159 Encounter for screening for other viral diseases: Secondary | ICD-10-CM | POA: Diagnosis not present

## 2017-10-01 DIAGNOSIS — I10 Essential (primary) hypertension: Secondary | ICD-10-CM | POA: Diagnosis not present

## 2017-10-01 DIAGNOSIS — Z114 Encounter for screening for human immunodeficiency virus [HIV]: Secondary | ICD-10-CM

## 2017-10-01 DIAGNOSIS — N62 Hypertrophy of breast: Secondary | ICD-10-CM

## 2017-10-01 LAB — COMPREHENSIVE METABOLIC PANEL
ALT: 22 U/L (ref 0–35)
AST: 19 U/L (ref 0–37)
Albumin: 4.2 g/dL (ref 3.5–5.2)
Alkaline Phosphatase: 88 U/L (ref 39–117)
BUN: 11 mg/dL (ref 6–23)
CO2: 34 mEq/L — ABNORMAL HIGH (ref 19–32)
Calcium: 9.9 mg/dL (ref 8.4–10.5)
Chloride: 94 mEq/L — ABNORMAL LOW (ref 96–112)
Creatinine, Ser: 0.68 mg/dL (ref 0.40–1.20)
GFR: 98.01 mL/min (ref >60.00)
Glucose, Bld: 94 mg/dL (ref 70–99)
Potassium: 3.5 mEq/L (ref 3.5–5.1)
Sodium: 137 mEq/L (ref 135–145)
Total Bilirubin: 0.3 mg/dL (ref 0.2–1.2)
Total Protein: 6.9 g/dL (ref 6.0–8.3)

## 2017-10-01 LAB — LIPID PANEL
Cholesterol: 223 mg/dL — ABNORMAL HIGH (ref 0–200)
HDL: 57.5 mg/dL (ref >39.00)
NonHDL: 165.75
Total CHOL/HDL Ratio: 4
Triglycerides: 222 mg/dL — ABNORMAL HIGH (ref 0.0–149.0)
VLDL: 44.4 mg/dL — ABNORMAL HIGH (ref 0.0–40.0)

## 2017-10-01 LAB — LDL CHOLESTEROL, DIRECT: Direct LDL: 137 mg/dL

## 2017-10-01 NOTE — Progress Notes (Signed)
Subjective  CC:  Chief Complaint  Patient presents with  . Establish Care  . Hypertension  . Hypothyroidism  . Obesity    HPI: Angelica Lester is a 47 y.o. female who presents to [place] at [facility] today to establish care with me as a new patient. She is a former [place] patient and is here to reestablish care with me today.   She has the following concerns or needs: follow up chronic problems. Last cpe July 2018; I reviewed notes and labs.    Hypertension f/u: Control is good . Pt reports she is doing well. taking medications as instructed, no medication side effects noted, no TIAs, no chest pain on exertion, no dyspnea on exertion, no swelling of ankles. She denies adverse effects from his BP medications. Compliance with medication is good.   Hypothyroidism f/u: Angelica Lester is a 47 y.o. female who presents for follow up of hypothyroidism. Last TSH showed control was excellent - see scanned labs from endocrine. Thyroid medication was increased to 154mcg daily in November - now at goal of TSH around 1.   Current symptoms: none . Patient denies change in energy level, diarrhea, heat / cold intolerance, nervousness, palpitations and weight changes. Symptoms have been well-controlled.She has been compliant with the medication.  Obesity: had lost about 6 pounds but regained over holidays. Now she and coworkers are again working on [employer] loss. Starting to walk again. Breast reduction surgery; ready for referral. Has daily low back pain unrelieved with otc pain meds; limits her activities and drains her energy levels.  Wt Readings from Last 3 Encounters:  10/01/17 222 lb 6.4 oz (100.9 kg)  10/19/16 222 lb 7 oz (100.9 kg)  10/13/16 222 lb 7 oz (100.9 kg)    BP Readings from Last 3 Encounters:  10/01/17 122/84  10/20/16 131/75  10/13/16 123/85   Wt Readings from Last 3 Encounters:  10/01/17 222 lb 6.4 oz (100.9 kg)  10/19/16 222 lb 7 oz (100.9 kg)  10/13/16 222  lb 7 oz (100.9 kg)   Labs reviewed from July 2018.  The ASCVD Risk score [name], et al., 2013) failed to calculate for the following reasons:   Cannot find a previous HDL lab  We updated and reviewed the patient's past history in detail and it is documented below.  Patient Active Problem List   Diagnosis Date Noted  . Essential hypertension 03/12/2017    Priority: High  . 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    Priority: High  . Obesity (BMI 30-39.9) 03/09/2016    Priority: High  . Family history of colon cancer 09/27/2013    Priority: High  . GERD (gastroesophageal reflux disease) 09/27/2013    Priority: Medium  . Irritable bowel syndrome 08/28/2010    Priority: Medium  . Migraine headache 02/24/2010    Priority: Medium  . Perimenopausal 03/12/2017    Priority: Low  . Seasonal allergies 01/15/2014    Priority: Low  . 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 HIV Screening  05/24/1984  . TETANUS/TDAP  03/08/2022  . PAP SMEAR  03/15/2022  . INFLUENZA VACCINE  Completed   Immunization History  Administered Date(s) Administered  . Influenza Split 06/25/2011  . Influenza, Seasonal, Injecte, Preservative Fre 07/09/2015  . Influenza,inj,Quad PF,6+ Mos 06/21/2014  . Influenza-Unspecified 06/07/2017  . Tdap 09/08/2003, 03/08/2012   Current Meds  Medication Sig  . hydrochlorothiazide (HYDRODIURIL) 25 MG  tablet Take 25 mg by mouth daily.  . montelukast (SINGULAIR) 10 MG tablet Take 10 mg by mouth daily.  . Multiple Vitamins-Minerals (MULTIVITAMIN WITH MINERALS) tablet Take 1 tablet by mouth daily.  . SUMAtriptan (IMITREX) 100 MG tablet Take 100 mg by mouth every 2 (two) hours as needed for migraine. May repeat in 2 hours if headache persists or recurs.   Marland Kitchen SYNTHROID 150 MCG tablet Take 150 mcg by mouth daily.  . [DISCONTINUED] levothyroxine (SYNTHROID, LEVOTHROID) 125 MCG tablet Take 150 mcg by mouth daily before breakfast.      Allergies: Patient is allergic to amoxicillin. 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 Past Surgical History Patient  has a past surgical history that includes Total thyroidectomy (10/19/2016); Mohs surgery (Right); Dilation and curettage of uterus (X 1); Thyroidectomy (N/A, 10/19/2016); and Laparoscopic cholecystectomy (2005). Family History: Patient family history includes Alzheimer's disease in her paternal grandmother; Atrial fibrillation in her father and mother; COPD in her mother; Cancer in her paternal grandfather; Heart disease in her sister; Hyperlipidemia in her mother; Hypertension in her mother; Liver disease in her sister. Social History:  Patient  reports that  has never smoked. she has never used smokeless tobacco. She reports that she drinks about 1.8 oz of alcohol per week. She reports that she does not use drugs.  Review of Systems: Constitutional: negative for fever or malaise Ophthalmic: negative for photophobia, double vision or loss of vision Cardiovascular: negative for chest pain, dyspnea on exertion, or new LE swelling Respiratory: negative for SOB or persistent cough Gastrointestinal: negative for abdominal pain, change in bowel habits or melena Genitourinary: negative for dysuria or gross hematuria Musculoskeletal: negative for new gait disturbance or muscular weakness Integumentary: negative for new or persistent rashes Neurological: negative for TIA or stroke symptoms Psychiatric: negative for SI or delusions Allergic/Immunologic: negative for hives  Patient Care Team    Relationship Specialty Notifications Start End  Leamon Arnt, MD PCP - General Family  Medicine  10/12/16   Madelin Rear, MD Consulting Physician Endocrinology  10/01/17     Objective  Vitals: BP 122/84 (BP Location: Left Arm, Patient Position: Sitting, Cuff Size: Normal)   Pulse 84   Temp 98.3 F (36.8 C) (Oral)   Ht 5' 4.25" (1.632 m)   Wt 222 lb 6.4 oz (100.9 kg)   BMI 37.88 kg/m  General:  Well developed, well nourished, no acute distress  Psych:  Alert and oriented,normal mood and affect HEENT:  Normocephalic, atraumatic, non-icteric sclera, PERRL, oropharynx is without mass or exudate, supple neck without adenopathy, mass or thyromegaly Cardiovascular:  RRR without gallop, rub or murmur, no edema Respiratory:  Good breath sounds bilaterally, CTAB with normal respiratory effort Skin:  Warm, no rashes or suspicious lesions noted Neurologic:    Mental status is normal. No tremor  Assessment  1. Essential hypertension   2. Postoperative hypothyroidism   3. Obesity (BMI 30-39.9)   4. Need for hepatitis C screening test   5. Encounter for screening for HIV   6. Breast hypertrophy in female      Plan   Hypertension follow up: This medical condition is well controlled. There are no signs of complications, medication side effects, or red flags. Patient is instructed to continue the current treatment plan without change in therapies  or medications.    Hyperlipidemia f/u: This medical condition is well controlled. There are no signs of complications, medication side effects, or red flags. Patient is instructed to continue the current treatment plan without change in therapies or medications.   Hypothyroidism f/u: at goal by last recheck of TSH; Continue taking daily on empty stomach;avoid iron or Vit D/ca for four hours. Discussed sxs of low and high thyroid levels.   Work on weight loss for obesity.  Referred to Dr. Harlow Mares for breast reduction surgery.   Follow up:  Return in about 6 months (around 03/31/2018) for complete physical.  Commons side effects,  risks, benefits, and alternatives for medications and treatment plan prescribed today were discussed, and the patient expressed understanding of the given instructions. Patient is instructed to call or message via MyChart if he/she has any questions or concerns regarding our treatment plan. No barriers to understanding were identified. We discussed Red Flag symptoms and signs in detail. Patient expressed understanding regarding what to do in case of urgent or emergency type symptoms.   Medication list was reconciled, printed and provided to the patient in AVS. Patient instructions and summary information was reviewed with the patient as documented in the AVS. This note was prepared with assistance of Dragon voice recognition software. Occasional wrong-word or sound-a-like substitutions may have occurred due to the inherent limitations of voice recognition software  Orders Placed This Encounter  Procedures  . Comprehensive metabolic panel  . Lipid panel  . HIV antibody  . Hepatitis C antibody  . Ambulatory referral to Plastic Surgery   No orders of the defined types were placed in this encounter.

## 2017-10-01 NOTE — Patient Instructions (Addendum)
It was so good seeing you again! Thank you for establishing with my new practice and allowing me to continue caring for you. It means a lot to me.   We will call you with information regarding your referral appointment. Dr. Harlow Mares, plastic surgery  Please schedule a follow up appointment with me in July for your complete physical.  Caltrate plus twice a day.   Fat and Cholesterol Restricted Diet Getting too much fat and cholesterol in your diet may cause health problems. Following this diet helps keep your fat and cholesterol at normal levels. This can keep you from getting sick. What types of fat should I choose?  Choose monosaturated and polyunsaturated fats. These are found in foods such as olive oil, canola oil, flaxseeds, walnuts, almonds, and seeds.  Eat more omega-3 fats. Good choices include salmon, mackerel, sardines, tuna, flaxseed oil, and ground flaxseeds.  Limit saturated fats. These are in animal products such as meats, butter, and cream. They can also be in plant products such as palm oil, palm kernel oil, and coconut oil.  Avoid foods with partially hydrogenated oils in them. These contain trans fats. Examples of foods that have trans fats are stick margarine, some tub margarines, cookies, crackers, and other baked goods. What general guidelines do I need to follow?  Check food labels. Look for the words "trans fat" and "saturated fat."  When preparing a meal: ? Fill half of your plate with vegetables and green salads. ? Fill one fourth of your plate with whole grains. Look for the word "whole" as the first word in the ingredient list. ? Fill one fourth of your plate with lean protein foods.  Eat more foods that have fiber, like apples, carrots, beans, peas, and barley.  Eat more home-cooked foods. Eat less at restaurants and buffets.  Limit or avoid alcohol.  Limit foods high in starch and sugar.  Limit fried foods.  Cook foods without frying them. Baking,  boiling, grilling, and broiling are all great options.  Lose weight if you are overweight. Losing even a small amount of weight can help your overall health. It can also help prevent diseases such as diabetes and heart disease. What foods can I eat? Grains Whole grains, such as whole wheat or whole grain breads, crackers, cereals, and pasta. Unsweetened oatmeal, bulgur, barley, quinoa, or brown rice. Corn or whole wheat flour tortillas. Vegetables Fresh or frozen vegetables (raw, steamed, roasted, or grilled). Green salads. Fruits All fresh, canned (in natural juice), or frozen fruits. Meat and Other Protein Products Ground beef (85% or leaner), grass-fed beef, or beef trimmed of fat. Skinless chicken or [place]. Ground chicken or [place]. Pork trimmed of fat. All fish and seafood. Eggs. Dried beans, peas, or lentils. Unsalted nuts or seeds. Unsalted canned or dry beans. Dairy Low-fat dairy products, such as skim or 1% milk, 2% or reduced-fat cheeses, low-fat ricotta or cottage cheese, or plain low-fat yogurt. Fats and Oils Tub margarines without trans fats. Light or reduced-fat mayonnaise and salad dressings. Avocado. Olive, canola, sesame, or safflower oils. Natural peanut or almond butter (choose ones without added sugar and oil). The items listed above may not be a complete list of recommended foods or beverages. Contact your dietitian for more options. What foods are not recommended? Grains White bread. White pasta. White rice. Cornbread. Bagels, pastries, and croissants. Crackers that contain trans fat. Vegetables White potatoes. Corn. Creamed or fried vegetables. Vegetables in a cheese sauce. Fruits Dried fruits. Canned fruit in light or  heavy syrup. Fruit juice. Meat and Other Protein Products Fatty cuts of meat. Ribs, chicken wings, bacon, sausage, bologna, salami, chitterlings, fatback, hot dogs, bratwurst, and packaged luncheon meats. Liver and organ meats. Dairy Whole or 2% milk,  cream, half-and-half, and cream cheese. Whole milk cheeses. Whole-fat or sweetened yogurt. Full-fat cheeses. Nondairy creamers and whipped toppings. Processed cheese, cheese spreads, or cheese curds. Sweets and Desserts Corn syrup, sugars, honey, and molasses. Candy. Jam and jelly. Syrup. Sweetened cereals. Cookies, pies, cakes, donuts, muffins, and ice cream. Fats and Oils Butter, stick margarine, lard, shortening, ghee, or bacon fat. Coconut, palm kernel, or palm oils. Beverages Alcohol. Sweetened drinks (such as sodas, lemonade, and fruit drinks or punches). The items listed above may not be a complete list of foods and beverages to avoid. Contact your dietitian for more information. This information is not intended to replace advice given to you by your health care provider. Make sure you discuss any questions you have with your health care provider. Document Released: 02/23/2012 Document Revised: 04/30/2016 Document Reviewed: 11/23/2013 Elsevier Interactive Patient Education  [employer].

## 2017-10-02 LAB — HEPATITIS C ANTIBODY
Hepatitis C Ab: NONREACTIVE
SIGNAL TO CUT-OFF: 0.01 (ref <1.00)

## 2017-10-02 LAB — HIV ANTIBODY (ROUTINE TESTING W REFLEX): HIV 1&2 Ab, 4th Generation: NONREACTIVE

## 2017-10-12 ENCOUNTER — Encounter: Payer: Self-pay | Admitting: Family Medicine

## 2017-10-13 ENCOUNTER — Other Ambulatory Visit: Payer: Self-pay | Admitting: Emergency Medicine

## 2017-10-13 MED ORDER — HYDROCHLOROTHIAZIDE 25 MG PO TABS: 25.0000 mg | ORAL_TABLET | Freq: Every day | ORAL | 1 refills | Status: DC

## 2017-12-03 ENCOUNTER — Encounter: Payer: Self-pay | Admitting: Family Medicine

## 2017-12-03 LAB — HM MAMMOGRAPHY

## 2017-12-14 ENCOUNTER — Other Ambulatory Visit: Payer: Self-pay | Admitting: Plastic Surgery

## 2017-12-14 HISTORY — PX: BREAST SURGERY: SHX581

## 2018-01-19 ENCOUNTER — Encounter: Payer: Self-pay | Admitting: Family Medicine

## 2018-02-28 ENCOUNTER — Encounter: Payer: Self-pay | Admitting: Family Medicine

## 2018-02-28 ENCOUNTER — Ambulatory Visit: Payer: Managed Care, Other (non HMO) | Admitting: Family Medicine

## 2018-02-28 ENCOUNTER — Other Ambulatory Visit: Payer: Self-pay

## 2018-02-28 VITALS — BP 132/82 | HR 86 | Temp 97.9°F | Resp 15 | Ht 64.25 in | Wt 221.2 lb

## 2018-02-28 DIAGNOSIS — L255 Unspecified contact dermatitis due to plants, except food: Secondary | ICD-10-CM | POA: Diagnosis not present

## 2018-02-28 MED ORDER — PREDNISONE 10 MG PO TABS: ORAL_TABLET | ORAL | 0 refills | Status: DC

## 2018-02-28 NOTE — Progress Notes (Signed)
Subjective  CC:  Chief Complaint  Patient presents with  . Poison Ivy    hands and right side of neck behind ear    HPI: Angelica Lester is a 47 y.o. female who presents to the office today to address the problems listed above in the chief complaint.  PI - small amount on left neck and 2 fingers of left hand. Has had before. No problems with prednisone.   S/p breast reduction surgery: recovering well.   Assessment  1. Rhus dermatitis      Plan   PI:  pred taper. Supportive care. Discussed side effects.   Follow up: Return for as scheduled cpe .   No orders of the defined types were placed in this encounter.  Meds ordered this encounter  Medications  . predniSONE (DELTASONE) 10 MG tablet    Sig: Take 4 tabs qd x 2 days, 3 qd x 2 days, 2 qd x 2d, 1qd x 3 days    Dispense:  21 tablet    Refill:  0      I reviewed the patients updated PMH, FH, and SocHx.    Patient Active Problem List   Diagnosis Date Noted  . Essential hypertension 03/12/2017    Priority: High  . 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    Priority: High  . Obesity (BMI 30-39.9) 03/09/2016    Priority: High  . Family history of colon cancer 09/27/2013    Priority: High  . GERD (gastroesophageal reflux disease) 09/27/2013    Priority: Medium  . Irritable bowel syndrome 08/28/2010    Priority: Medium  . Migraine headache 02/24/2010    Priority: Medium  . Perimenopausal 03/12/2017    Priority: Low  . Seasonal allergies 01/15/2014    Priority: Low  . 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 Ascorbic Acid (VITAMIN C) 100 MG tablet Take by mouth.  . Cyanocobalamin (VITAMIN B-12) 5000 MCG SUBL Place under the tongue.  . fluticasone (FLONASE) 50 MCG/ACT nasal spray Place into the nose.  . hydrochlorothiazide (HYDRODIURIL) 25 MG tablet Take 1 tablet (25 mg total) by mouth daily.  Marland Kitchen ibuprofen (ADVIL,MOTRIN) 200 MG tablet Take 400 mg by mouth as needed for  headache, mild pain or moderate pain.   . montelukast (SINGULAIR) 10 MG tablet Take 10 mg by mouth daily.  . Multiple Vitamins-Minerals (MULTIVITAMIN WITH MINERALS) tablet Take 1 tablet by mouth daily.  . SUMAtriptan (IMITREX) 100 MG tablet Take 100 mg by mouth every 2 (two) hours as needed for migraine. May repeat in 2 hours if headache persists or recurs.   Marland Kitchen SYNTHROID 150 MCG tablet Take 150 mcg by mouth daily.    Allergies: Patient is allergic to fexofenadine and amoxicillin. Family History: Patient family history includes Alzheimer's disease in her paternal grandmother; Atrial fibrillation in her father and mother; COPD in her mother; Cancer in her paternal grandfather; Heart disease in her sister; Hyperlipidemia in her mother; Hypertension in her mother; Liver disease in her sister. Social History:  Patient  reports that she has never smoked. She has never used smokeless tobacco. She reports that she drinks about 1.8 oz of alcohol per week. She reports that she does not use drugs.  Review of Systems: Constitutional: Negative for fever malaise or anorexia Cardiovascular: negative for chest pain Respiratory: negative for SOB or persistent cough Gastrointestinal: negative for abdominal pain  Objective  Vitals: BP 132/82   Pulse 86   Temp  97.9 F (36.6 C) (Oral)   Resp 15   Ht 5' 4.25" (1.632 m)   Wt 221 lb 3.2 oz (100.3 kg)   SpO2 98%   BMI 37.67 kg/m  General: no acute distress , A&Ox3 Skin:  Warm, left neck with classic vesicular rash, linear; 2 small spots on hand as well    Commons side effects, risks, benefits, and alternatives for medications and treatment plan prescribed today were discussed, and the patient expressed understanding of the given instructions. Patient is instructed to call or message via MyChart if he/she has any questions or concerns regarding our treatment plan. No barriers to understanding were identified. We discussed Red Flag symptoms and signs in  detail. Patient expressed understanding regarding what to do in case of urgent or emergency type symptoms.   Medication list was reconciled, printed and provided to the patient in AVS. Patient instructions and summary information was reviewed with the patient as documented in the AVS. This note was prepared with assistance of Dragon voice recognition software. Occasional wrong-word or sound-a-like substitutions may have occurred due to the inherent limitations of voice recognition software

## 2018-02-28 NOTE — Patient Instructions (Signed)
Please follow up if symptoms do not improve or as needed.   

## 2018-03-29 ENCOUNTER — Ambulatory Visit (INDEPENDENT_AMBULATORY_CARE_PROVIDER_SITE_OTHER): Payer: Managed Care, Other (non HMO) | Admitting: Family Medicine

## 2018-03-29 ENCOUNTER — Other Ambulatory Visit: Payer: Self-pay

## 2018-03-29 ENCOUNTER — Other Ambulatory Visit (INDEPENDENT_AMBULATORY_CARE_PROVIDER_SITE_OTHER): Payer: Managed Care, Other (non HMO)

## 2018-03-29 ENCOUNTER — Encounter: Payer: Self-pay | Admitting: Family Medicine

## 2018-03-29 VITALS — BP 124/82 | HR 72 | Temp 98.0°F | Resp 16 | Ht 65.5 in | Wt 219.4 lb

## 2018-03-29 DIAGNOSIS — Z0000 Encounter for general adult medical examination without abnormal findings: Secondary | ICD-10-CM

## 2018-03-29 DIAGNOSIS — I10 Essential (primary) hypertension: Secondary | ICD-10-CM

## 2018-03-29 DIAGNOSIS — N951 Menopausal and female climacteric states: Secondary | ICD-10-CM | POA: Diagnosis not present

## 2018-03-29 DIAGNOSIS — E669 Obesity, unspecified: Secondary | ICD-10-CM | POA: Diagnosis not present

## 2018-03-29 DIAGNOSIS — E890 Postprocedural hypothyroidism: Secondary | ICD-10-CM

## 2018-03-29 LAB — COMPREHENSIVE METABOLIC PANEL
ALT: 36 U/L — ABNORMAL HIGH (ref 0–35)
AST: 24 U/L (ref 0–37)
Albumin: 4.2 g/dL (ref 3.5–5.2)
Alkaline Phosphatase: 79 U/L (ref 39–117)
BUN: 10 mg/dL (ref 6–23)
CO2: 32 mEq/L (ref 19–32)
Calcium: 9.4 mg/dL (ref 8.4–10.5)
Chloride: 98 mEq/L (ref 96–112)
Creatinine, Ser: 0.78 mg/dL (ref 0.40–1.20)
GFR: 83.48 mL/min (ref >60.00)
Glucose, Bld: 98 mg/dL (ref 70–99)
Potassium: 3.5 mEq/L (ref 3.5–5.1)
Sodium: 138 mEq/L (ref 135–145)
Total Bilirubin: 0.4 mg/dL (ref 0.2–1.2)
Total Protein: 6.8 g/dL (ref 6.0–8.3)

## 2018-03-29 LAB — CBC WITH DIFFERENTIAL/PLATELET
Basophils Absolute: 0 10*3/uL (ref 0.0–0.1)
Basophils Relative: 0.3 % (ref 0.0–3.0)
Eosinophils Absolute: 0.2 10*3/uL (ref 0.0–0.7)
Eosinophils Relative: 3 % (ref 0.0–5.0)
HCT: 39.3 % (ref 36.0–46.0)
Hemoglobin: 13.1 g/dL (ref 12.0–15.0)
Lymphocytes Relative: 24.1 % (ref 12.0–46.0)
Lymphs Abs: 1.6 10*3/uL (ref 0.7–4.0)
MCHC: 33.4 g/dL (ref 30.0–36.0)
MCV: 82.5 fl (ref 78.0–100.0)
Monocytes Absolute: 0.4 10*3/uL (ref 0.1–1.0)
Monocytes Relative: 5.6 % (ref 3.0–12.0)
Neutro Abs: 4.3 10*3/uL (ref 1.4–7.7)
Neutrophils Relative %: 67 % (ref 43.0–77.0)
Platelets: 338 10*3/uL (ref 150.0–400.0)
RBC: 4.77 Mil/uL (ref 3.87–5.11)
RDW: 15.7 % — ABNORMAL HIGH (ref 11.5–15.5)
WBC: 6.4 10*3/uL (ref 4.0–10.5)

## 2018-03-29 LAB — POCT URINALYSIS DIPSTICK
Bilirubin, UA: NEGATIVE
Blood, UA: NEGATIVE
Glucose, UA: NEGATIVE
Ketones, UA: NEGATIVE
Leukocytes, UA: NEGATIVE
Nitrite, UA: NEGATIVE
Protein, UA: NEGATIVE
Spec Grav, UA: 1.01 (ref 1.010–1.025)
Urobilinogen, UA: 0.2 E.U./dL
pH, UA: 7.5 (ref 5.0–8.0)

## 2018-03-29 LAB — LIPID PANEL
Cholesterol: 194 mg/dL (ref 0–200)
HDL: 56.5 mg/dL (ref >39.00)
LDL Cholesterol: 111 mg/dL — ABNORMAL HIGH (ref 0–99)
NonHDL: 137.56
Total CHOL/HDL Ratio: 3
Triglycerides: 131 mg/dL (ref 0.0–149.0)
VLDL: 26.2 mg/dL (ref 0.0–40.0)

## 2018-03-29 LAB — TSH: TSH: 2.15 u[IU]/mL (ref 0.35–4.50)

## 2018-03-29 NOTE — Progress Notes (Signed)
Subjective  Chief Complaint  Patient presents with  . Annual Exam    Discuss test for menopause  f/u HTN, hypothyroidism and weight mgt  HPI: Angelica Lester is a 47 y.o. female who presents to [facility] today for a Female Wellness Visit.  She also has the concerns and/or needs as listed above in the chief complaint. These will be addressed in addition to the Health Maintenance Visit.   Wellness Visit: annual visit with health maintenance review and exam without Pap   Health maintenance is up-to-date.  Status post breast reduction in April and still recovering.  No complications.  Deferred breast exam today.  Immunizations up-to-date.  Chronic disease management visit and/or acute problem visit:  Hypertension f/u: Control is fair . Pt reports she is doing well. taking medications as instructed, no medication side effects noted, no TIAs, no chest pain on exertion, no dyspnea on exertion, no swelling of ankles. She denies adverse effects from his BP medications. Compliance with medication is good.   Hypothyroidism: Has been well controlled and managed per endocrine.  No symptoms of low or high thyroid.  Obesity: She continues to work with coworkers on trying to eat better.  She admits that she eats a significant amount of fast food out of convenience.  Exercise is rare or intermittent.  She does have access to the elliptical machine in her home.  Complains of increased body temperature most of the time flashes at night interfering with sleep.  She also has interrupted sleep.  She denies vaginal dryness urinary symptoms.  Menstrual cycles have been mildly regular over the last year.  LMP was in May.  Menstrual cycles have been light.  She would like to avoid hormones if possible.  BP Readings from Last 3 Encounters:  03/29/18 124/82  02/28/18 132/82  10/01/17 122/84   Wt Readings from Last 3 Encounters:  03/29/18 219 lb 6.4 oz (99.5 kg)  02/28/18 221 lb  3.2 oz (100.3 kg)  10/01/17 222 lb 6.4 oz (100.9 kg)    Lab Results  Component Value Date   CHOL 223 (H) 10/01/2017   Lab Results  Component Value Date   HDL 57.50 10/01/2017   No results found for: [facility] Lab Results  Component Value Date   TRIG 222.0 (H) 10/01/2017   Lab Results  Component Value Date   CHOLHDL 4 10/01/2017   Lab Results  Component Value Date   LDLDIRECT 137.0 10/01/2017   Lab Results  Component Value Date   CREATININE 0.68 10/01/2017   BUN 11 10/01/2017   NA 137 10/01/2017   K 3.5 10/01/2017   CL 94 (L) 10/01/2017   CO2 34 (H) 10/01/2017    The 10-year ASCVD risk score [name], et al., 2013) is: 1.5%   Values used to calculate the score:     Age: 64 years     Sex: Female     Is Non-Hispanic African American: No     Diabetic: No     Tobacco smoker: No     Systolic Blood Pressure: 161 mmHg     Is BP treated: Yes     HDL Cholesterol: 57.5 mg/dL     Total Cholesterol: 223 mg/dL  Assessment  1. Annual physical exam   2. Essential hypertension   3. Perimenopausal   4. Postoperative hypothyroidism      Plan  Female Wellness Visit:  Age appropriate Health Maintenance and Prevention measures were discussed with patient. Included  topics are cancer screening recommendations, ways to keep healthy (see AVS) including dietary and exercise recommendations, regular eye and dental care, use of seat belts, and avoidance of moderate alcohol use and tobacco use.   BMI: discussed patient's BMI and encouraged positive lifestyle modifications to help get to or maintain a target BMI.  We discussed increasing exercise, increasing veggies and lean proteins.  We problem solved around eating conveniently and quickly.  Discussed better choices and fast food when eating out.  HM needs and immunizations were addressed and ordered. See below for orders. See HM and immunization section for updates.  Routine labs and screening tests ordered including cmp, cbc and  lipids where appropriate.  Discussed recommendations regarding Vit D and calcium supplementation (see AVS)  Chronic disease f/u and/or acute problem visit: (deemed necessary to be done in addition to the wellness visit):  Hypertension:  This medical condition is well controlled. There are no signs of complications, medication side effects, or red flags. Patient is instructed to continue the current treatment plan without change in therapies or medications.   Recheck thyroid. Stable clinically  Perimenopausal symptoms discussed in detail.  Educated on perimenopausal to menopausal transition.  She defers hormone replacement therapy or cycle management with hormones at this time.  Instead will use alternative therapy such as black cohosh or soy products.  She will monitor her symptoms and return if they worsen or needs more help.  Follow up: Return in about 6 months (around 09/29/2018) for follow up Hypertension.   Orders Placed This Encounter  Procedures  . CBC with Differential/Platelet  . Comprehensive metabolic panel  . Lipid panel  . TSH  . POCT urinalysis dipstick   No orders of the defined types were placed in this encounter.     Lifestyle: Body mass index is 35.95 kg/m. Wt Readings from Last 3 Encounters:  03/29/18 219 lb 6.4 oz (99.5 kg)  02/28/18 221 lb 3.2 oz (100.3 kg)  10/01/17 222 lb 6.4 oz (100.9 kg)   Diet: general Exercise: rarely,  Need for contraception: No,   Patient Active Problem List   Diagnosis Date Noted  . Essential hypertension 03/12/2017    Priority: High  . 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 Obesity (BMI 30-39.9) 03/09/2016    Priority: High  . Family history of colon cancer 09/27/2013    Priority: High  . GERD (gastroesophageal reflux disease) 09/27/2013    Priority: Medium  . Irritable bowel syndrome 08/28/2010    Priority: Medium     Overview:  Constipation predominant   . Migraine headache 02/24/2010    Priority: Medium  . Perimenopausal 03/12/2017    Priority: Low  . Seasonal allergies 01/15/2014    Priority: Low  . 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 INFLUENZA VACCINE  04/07/2018  . TETANUS/TDAP  03/08/2022  . PAP SMEAR  03/15/2022  . HIV Screening  Completed   Immunization History  Administered Date(s) Administered  . Influenza Split 06/25/2011  . Influenza, Seasonal, Injecte, Preservative Fre 07/09/2015  . Influenza,inj,Quad PF,6+ Mos 06/21/2014  . Influenza-Unspecified 06/07/2017  . Tdap 09/08/2003, 03/08/2012   We updated and reviewed the patient's past history in detail and it is documented below. Allergies: Patient  reports that she drinks about 1.8 oz of alcohol per week. 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 Past Surgical History Patient  has a past surgical history that includes Total thyroidectomy (10/19/2016); Mohs surgery (Right); Dilation and curettage of uterus (X 1); Thyroidectomy (N/A, 10/19/2016); Laparoscopic cholecystectomy (2005); and Breast surgery (12/14/2017). Social History   Socioeconomic History  . Marital status: Divorced    Spouse name: Not on file  . Number of children: 0  . Years of education: Not on file  . Highest education level: Not on file  Occupational History  . Not on file  Social Needs  . Financial resource strain: Not on file  . Food insecurity:    Worry: Not on file    Inability: Not on file  . Transportation needs:    Medical: Not on file    Non-medical: Not on file  Tobacco Use  . Smoking status: Never Smoker   . Smokeless tobacco: Never Used  Substance and Sexual Activity  . Alcohol use: Yes    Alcohol/week: 1.8 oz    Types: 3 Glasses of wine per week    Comment: occasionally  . Drug use: No  . Sexual activity: Yes  Lifestyle  . Physical activity:    Days per week: Not on file    Minutes per session: Not on file  . Stress: Not on file  Relationships  . Social connections:    Talks on phone: Not on file    Gets together: Not on file    Attends religious service: Not on file    Active member of club or organization: Not on file    Attends meetings of clubs or organizations: Not on file    Relationship status: Not on file  Other Topics Concern  . Not on file  Social History Narrative  . Not on file   Family History  Problem Relation Age of Onset  . Atrial fibrillation Mother   . COPD Mother   . Hyperlipidemia Mother   . Hypertension Mother   . Atrial fibrillation Father   . Heart disease Sister   . Alzheimer's disease Paternal Grandmother   . Colon cancer Paternal Grandfather   . Liver disease Sister        Liver Transplant    Review of Systems: Constitutional: negative for fever or malaise Ophthalmic: negative for photophobia, double vision or loss of vision Cardiovascular: negative for chest pain, dyspnea on exertion, or new LE swelling Respiratory: negative for SOB or persistent cough Gastrointestinal: negative for abdominal pain, change in bowel habits or melena Genitourinary: negative for dysuria or gross hematuria, no abnormal uterine bleeding or disharge Musculoskeletal: negative for new gait disturbance or muscular weakness Integumentary: negative for new or persistent rashes, no breast lumps Neurological: negative for TIA or stroke symptoms Psychiatric: negative for SI or delusions Allergic/Immunologic: negative for hives  Patient Care Team    Relationship Specialty Notifications Start End  Leamon Arnt, MD PCP - General Family Medicine  10/12/16   Madelin Rear, MD Consulting Physician Endocrinology  10/01/17     Objective  Vitals: BP 124/82   Pulse 72   Temp 98 F (36.7 C) (Oral)   Resp 16   Ht 5' 5.5" (1.664 m)   Wt 219 lb 6.4 oz (99.5 kg)   SpO2 97%   BMI 35.95 kg/m   General:  Well developed, well nourished, no acute  distress  Psych:  Alert and orientedx3,normal mood and affect HEENT:  Normocephalic, atraumatic, non-icteric sclera, PERRL, oropharynx is clear without mass or exudate, supple neck without adenopathy, mass or thyromegaly Cardiovascular:  Normal S1, S2, RRR without gallop, rub or murmur, nondisplaced PMI Respiratory:  Good breath sounds bilaterally, CTAB with normal respiratory effort Gastrointestinal: normal bowel sounds, soft, non-tender, no noted masses. No HSM MSK: no deformities, contusions. Joints are without erythema or swelling. Spine and CVA region are nontender Skin:  Warm, no rashes or suspicious lesions noted Neurologic:    Mental status is normal. CN 2-11 are normal. Gross motor and sensory exams are normal. Normal gait. No tremor Breast Exam: Deferred due to recent surgery.   Commons side effects, risks, benefits, and alternatives for medications and treatment plan prescribed today were discussed, and the patient expressed understanding of the given instructions. Patient is instructed to call or message via MyChart if he/she has any questions or concerns regarding our treatment plan. No barriers to understanding were identified. We discussed Red Flag symptoms and signs in detail. Patient expressed understanding regarding what to do in case of urgent or emergency type symptoms.   Medication list was reconciled, printed and provided to the patient in AVS. Patient instructions and summary information was reviewed with the patient as documented in the AVS. This note was prepared with assistance of Dragon voice recognition software. Occasional wrong-word or sound-a-like substitutions may have occurred due to the  inherent limitations of voice recognition software

## 2018-03-29 NOTE — Patient Instructions (Signed)
Please return in 6 months for recheck blood pressure Start weight training and elliptical.  Plan on 2 healthy meals at your home per week.  Eat food, not too much and mostly plants! :)  If you have any questions or concerns, please don't hesitate to send me a message via MyChart or call the office at [phone]. Thank you for visiting with [place] today! It's our pleasure caring for you.  You can consider black cohash or soy products to help with hot flashes. Valerian root, evening primrose can help with sleep.  Please do these things to maintain good health!   Exercise at least 30-45 minutes a day,  4-5 days a week.   Eat a low-fat diet with lots of fruits and vegetables, up to 7-9 servings per day.  Drink plenty of water daily. Try to drink 8 8oz glasses per day.  Seatbelts can save your life. Always wear your seatbelt.  Place Smoke Detectors on every level of your home and check batteries every year.  Schedule an appointment with an eye doctor for an eye exam every 1-2 years  Safe sex - use condoms to protect yourself from STDs if you could be exposed to these types of infections. Use birth control if you do not want to become pregnant and are sexually active.  Avoid heavy alcohol use. If you drink, keep it to less than 2 drinks/day and not every day.  [place].  Choose someone you trust that could speak for you if you became unable to speak for yourself.  Depression is common in our stressful world.If you're feeling down or losing interest in things you normally enjoy, please come in for a visit.  If anyone is threatening or hurting you, please get help. Physical or Emotional Violence is never OK.   Perimenopause Perimenopause is the time when your body begins to move into the menopause (no menstrual period for 12 straight months). It is a natural process. Perimenopause can begin 2-8 years before the menopause and usually lasts for 1 year after the menopause.  During this time, your ovaries may or may not produce an egg. The ovaries vary in their production of estrogen and progesterone hormones each month. This can cause irregular menstrual periods, difficulty getting pregnant, vaginal bleeding between periods, and uncomfortable symptoms. What are the causes?  Irregular production of the ovarian hormones, estrogen and progesterone, and not ovulating every month. Other causes include:  Tumor of the pituitary gland in the brain.  Medical disease that affects the ovaries.  Radiation treatment.  Chemotherapy.  Unknown causes.  Heavy smoking and excessive alcohol intake can bring on perimenopause sooner.  What are the signs or symptoms?  Hot flashes.  Night sweats.  Irregular menstrual periods.  Decreased sex drive.  Vaginal dryness.  Headaches.  Mood swings.  Depression.  Memory problems.  Irritability.  Tiredness.  Weight gain.  Trouble getting pregnant.  The beginning of losing bone cells (osteoporosis).  The beginning of hardening of the arteries (atherosclerosis). How is this diagnosed? Your health care provider will make a diagnosis by analyzing your age, menstrual history, and symptoms. He or she will do a physical exam and note any changes in your body, especially your female organs. Female hormone tests may or may not be helpful depending on the amount of female hormones you produce and when you produce them. However, other hormone tests may be helpful to rule out other problems. How is this treated? In some cases, no  treatment is needed. The decision on whether treatment is necessary during the perimenopause should be made by you and your health care provider based on how the symptoms are affecting you and your lifestyle. Various treatments are available, such as:  Treating individual symptoms with a specific medicine for that symptom.  Herbal medicines that can help specific symptoms.  Counseling.  Group  therapy.  Follow these instructions at home:  Keep track of your menstrual periods (when they occur, how heavy they are, how long between periods, and how long they last) as well as your symptoms and when they started.  Only take over-the-counter or prescription medicines as directed by your health care provider.  Sleep and rest.  Exercise.  Eat a diet that contains calcium (good for your bones) and soy (acts like the estrogen hormone).  Do not smoke.  Avoid alcoholic beverages.  Take vitamin supplements as recommended by your health care provider. Taking vitamin E may help in certain cases.  Take calcium and vitamin D supplements to help prevent bone loss.  Group therapy is sometimes helpful.  Acupuncture may help in some cases. Contact a health care provider if:  You have questions about any symptoms you are having.  You need a referral to a specialist (gynecologist, psychiatrist, or psychologist). Get help right away if:  You have vaginal bleeding.  Your period lasts longer than 8 days.  Your periods are recurring sooner than 21 days.  You have bleeding after intercourse.  You have severe depression.  You have pain when you urinate.  You have severe headaches.  You have vision problems. This information is not intended to replace advice given to you by your health care provider. Make sure you discuss any questions you have with your health care provider. Document Released: 10/01/2004 Document Revised: 01/30/2016 Document Reviewed: 03/23/2013 Elsevier Interactive Patient Education  2017 [employer].

## 2018-04-11 ENCOUNTER — Other Ambulatory Visit: Payer: Self-pay | Admitting: Family Medicine

## 2018-07-06 ENCOUNTER — Ambulatory Visit: Payer: Managed Care, Other (non HMO) | Admitting: Family Medicine

## 2018-07-07 ENCOUNTER — Encounter: Payer: Self-pay | Admitting: Family Medicine

## 2018-07-07 ENCOUNTER — Other Ambulatory Visit: Payer: Self-pay

## 2018-07-07 ENCOUNTER — Ambulatory Visit: Payer: Managed Care, Other (non HMO) | Admitting: Family Medicine

## 2018-07-07 VITALS — BP 124/82 | HR 81 | Temp 97.8°F | Ht 65.5 in | Wt 223.8 lb

## 2018-07-07 DIAGNOSIS — E669 Obesity, unspecified: Secondary | ICD-10-CM

## 2018-07-07 DIAGNOSIS — E890 Postprocedural hypothyroidism: Secondary | ICD-10-CM

## 2018-07-07 DIAGNOSIS — I10 Essential (primary) hypertension: Secondary | ICD-10-CM | POA: Diagnosis not present

## 2018-07-07 LAB — TSH: TSH: 3.32 u[IU]/mL (ref 0.35–4.50)

## 2018-07-07 MED ORDER — MONTELUKAST SODIUM 10 MG PO TABS: 10.0000 mg | ORAL_TABLET | Freq: Every day | ORAL | 3 refills | Status: DC

## 2018-07-07 MED ORDER — FLUTICASONE PROPIONATE 50 MCG/ACT NA SUSP: 1.0000 | Freq: Every day | NASAL | 6 refills | Status: DC

## 2018-07-07 MED ORDER — HYDROCHLOROTHIAZIDE 25 MG PO TABS: 25.0000 mg | ORAL_TABLET | Freq: Every day | ORAL | 0 refills | Status: DC

## 2018-07-07 NOTE — Patient Instructions (Signed)
Please return in 6 months for follow up of your hypertension.   If you have any questions or concerns, please don't hesitate to send me a message via MyChart or call the office at [phone]. Thank you for visiting with us today! It's our pleasure caring for you.   

## 2018-07-07 NOTE — Progress Notes (Signed)
Subjective  CC:  Chief Complaint  Patient presents with  . Hypertension    doing well, no complaints   . Hypothyroidism    patient wants to discuss having her thyroid checked     HPI: Angelica Lester is a 47 y.o. female who presents to the office today to address the problems listed above in the chief complaint.  Hypertension f/u: Control is good . Pt reports she is doing well. taking medications as instructed, no medication side effects noted, no TIAs, no chest pain on exertion, no dyspnea on exertion, no swelling of ankles. She denies adverse effects from his BP medications. Compliance with medication is good.   Hypothyroidism: feels her levels are low. Skin dryness, brittle hair. Energy is a little down. No edema. Compliant with meds. Last checked in July and was nl Lab Results  Component Value Date   TSH 2.15 03/29/2018    Weight is a little up but has increased mm mass from lifting weights with coworkers. Feels better. Clothes are fitting better.   Assessment  1. Essential hypertension   2. Postoperative hypothyroidism   3. Obesity (BMI 30-39.9)      Plan    Hypertension f/u: BP control is well controlled. Refilled meds  Hypothyroidism f/u: recheck levels.   Obesity: continue with exercise and diet changes.  Education regarding management of these chronic disease states was given. Management strategies discussed on successive visits include dietary and exercise recommendations, goals of achieving and maintaining IBW, and lifestyle modifications aiming for adequate sleep and minimizing stressors.   Follow up: Return in about 6 months (around 01/05/2019) for follow up Hypertension.  Orders Placed This Encounter  Procedures  . TSH   Meds ordered this encounter  Medications  . hydrochlorothiazide (HYDRODIURIL) 25 MG tablet    Sig: Take 1 tablet (25 mg total) by mouth daily.    Dispense:  90 tablet    Refill:  0  . montelukast (SINGULAIR) 10 MG tablet    Sig: Take  1 tablet (10 mg total) by mouth daily.    Dispense:  90 tablet    Refill:  3  . fluticasone (FLONASE) 50 MCG/ACT nasal spray    Sig: Place 1 spray into both nostrils daily.    Dispense:  16 g    Refill:  6      BP Readings from Last 3 Encounters:  07/07/18 124/82  03/29/18 124/82  02/28/18 132/82   Wt Readings from Last 3 Encounters:  07/07/18 223 lb 12.8 oz (101.5 kg)  03/29/18 219 lb 6.4 oz (99.5 kg)  02/28/18 221 lb 3.2 oz (100.3 kg)    Lab Results  Component Value Date   CHOL 194 03/29/2018   CHOL 223 (H) 10/01/2017   Lab Results  Component Value Date   HDL 56.50 03/29/2018   HDL 57.50 10/01/2017   Lab Results  Component Value Date   LDLCALC 111 (H) 03/29/2018   Lab Results  Component Value Date   TRIG 131.0 03/29/2018   TRIG 222.0 (H) 10/01/2017   Lab Results  Component Value Date   CHOLHDL 3 03/29/2018   CHOLHDL 4 10/01/2017   Lab Results  Component Value Date   LDLDIRECT 137.0 10/01/2017   Lab Results  Component Value Date   CREATININE 0.78 03/29/2018   BUN 10 03/29/2018   NA 138 03/29/2018   K 3.5 03/29/2018   CL 98 03/29/2018   CO2 32 03/29/2018    The 10-year ASCVD risk score Mikey Bussing  DC Jr., et al., 2013) is: 1.3%   Values used to calculate the score:     Age: 41 years     Sex: Female     Is Non-Hispanic African American: No     Diabetic: No     Tobacco smoker: No     Systolic Blood Pressure: 235 mmHg     Is BP treated: Yes     HDL Cholesterol: 56.5 mg/dL     Total Cholesterol: 194 mg/dL  I reviewed the patients updated PMH, FH, and SocHx.    Patient Active Problem List   Diagnosis Date Noted  . Essential hypertension 03/12/2017    Priority: High  . 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    Priority: High  . Obesity (BMI 30-39.9) 03/09/2016    Priority: High  . Family history of colon cancer 09/27/2013    Priority: High  . GERD (gastroesophageal reflux disease) 09/27/2013    Priority: Medium  . Irritable bowel syndrome  08/28/2010    Priority: Medium  . Migraine headache 02/24/2010    Priority: Medium  . Perimenopausal 03/12/2017    Priority: Low  . Seasonal allergies 01/15/2014    Priority: Low  . 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 She has never used smokeless tobacco. She reports that she drinks about 3.0 standard drinks of alcohol per week. She reports that she does not use drugs.  Current Meds  Medication Sig  . calcium carbonate (TUMS) 500 MG chewable tablet Chew 2 tablets (400 mg of elemental calcium total) by mouth 2 (two) times daily.  . calcium-vitamin D (OSCAL WITH D) 250-125 MG-UNIT tablet Take 1 tablet by mouth daily.  . fluticasone (FLONASE) 50 MCG/ACT nasal spray Place 1 spray into both nostrils daily.  . hydrochlorothiazide (HYDRODIURIL) 25 MG tablet Take 1 tablet (25 mg total) by mouth daily.  Marland Kitchen ibuprofen (ADVIL,MOTRIN) 200 MG tablet Take 400 mg by mouth as needed for headache, mild pain or moderate pain.   . montelukast (SINGULAIR) 10 MG tablet Take 1 tablet (10 mg total) by mouth daily.  . Multiple Vitamins-Minerals (MULTIVITAMIN WITH MINERALS) tablet Take 1 tablet by mouth daily.  . SUMAtriptan (IMITREX) 100 MG tablet Take 100 mg by mouth every 2 (two) hours as needed for migraine. May repeat in 2 hours if headache persists or recurs.   Marland Kitchen SYNTHROID 150 MCG tablet Take 150 mcg by mouth daily.  . [DISCONTINUED] fluticasone (FLONASE) 50 MCG/ACT nasal spray Place into the nose.  . [DISCONTINUED] hydrochlorothiazide (HYDRODIURIL) 25 MG tablet TAKE ONE TABLET BY MOUTH DAILY  . [DISCONTINUED] montelukast (SINGULAIR) 10 MG tablet Take 10 mg by mouth daily.    Review of Systems:  Cardiovascular: negative for chest pain, palpitations, leg swelling, orthopnea Respiratory: negative for SOB, wheezing or persistent cough Gastrointestinal: negative for abdominal  pain Genitourinary: negative for dysuria or gross hematuria  Objective  Vitals: BP 124/82   Pulse 81   Temp 97.8 F (36.6 C)   Ht 5' 5.5" (1.664 m)   Wt 223 lb 12.8 oz (101.5 kg)   SpO2 98%   BMI 36.68 kg/m  General: no acute distress  Psych:  Alert and oriented, normal mood and affect HEENT:  Normocephalic, atraumatic, supple neck  Cardiovascular:  RRR without murmur. no edema Respiratory:  Good breath sounds bilaterally, CTAB with normal respiratory effort Skin:  Warm, no rashes Neurologic:   Mental status is  normal  Commons side effects, risks, benefits, and alternatives for medications and treatment plan prescribed today were discussed, and the patient expressed understanding of the given instructions. Patient is instructed to call or message via MyChart if he/she has any questions or concerns regarding our treatment plan. No barriers to understanding were identified. We discussed Red Flag symptoms and signs in detail. Patient expressed understanding regarding what to do in case of urgent or emergency type symptoms.   Medication list was reconciled, printed and provided to the patient in AVS. Patient instructions and summary information was reviewed with the patient as documented in the AVS. This note was prepared with assistance of Dragon voice recognition software. Occasional wrong-word or sound-a-like substitutions may have occurred due to the inherent limitations of voice recognition software

## 2018-08-23 ENCOUNTER — Encounter: Payer: Self-pay | Admitting: Family Medicine

## 2018-08-23 ENCOUNTER — Other Ambulatory Visit: Payer: Self-pay

## 2018-08-23 ENCOUNTER — Ambulatory Visit: Payer: Managed Care, Other (non HMO) | Admitting: Family Medicine

## 2018-08-23 VITALS — BP 122/82 | HR 92 | Temp 98.8°F | Resp 18 | Ht 66.0 in | Wt 220.2 lb

## 2018-08-23 DIAGNOSIS — B9689 Other specified bacterial agents as the cause of diseases classified elsewhere: Secondary | ICD-10-CM | POA: Diagnosis not present

## 2018-08-23 DIAGNOSIS — J329 Chronic sinusitis, unspecified: Secondary | ICD-10-CM | POA: Diagnosis not present

## 2018-08-23 DIAGNOSIS — R509 Fever, unspecified: Secondary | ICD-10-CM | POA: Diagnosis not present

## 2018-08-23 DIAGNOSIS — R05 Cough: Secondary | ICD-10-CM | POA: Diagnosis not present

## 2018-08-23 DIAGNOSIS — R059 Cough, unspecified: Secondary | ICD-10-CM

## 2018-08-23 MED ORDER — DOXYCYCLINE HYCLATE 100 MG PO TABS: 100.0000 mg | ORAL_TABLET | Freq: Two times a day (BID) | ORAL | 0 refills | Status: DC

## 2018-08-23 MED ORDER — GUAIFENESIN-CODEINE 100-10 MG/5ML PO SYRP: 10.0000 mL | ORAL_SOLUTION | Freq: Three times a day (TID) | ORAL | 0 refills | Status: DC | PRN

## 2018-08-23 MED ORDER — ALBUTEROL SULFATE HFA 108 (90 BASE) MCG/ACT IN AERS
2.0000 | INHALATION_SPRAY | Freq: Four times a day (QID) | RESPIRATORY_TRACT | 2 refills | Status: DC | PRN
Start: 2018-08-23 — End: 2019-01-05

## 2018-08-23 NOTE — Patient Instructions (Signed)
Follow up as needed or as scheduled START the Doxycycline twice daily- take w/ food Drink plenty of fluids REST! Use the cough syrup as needed- may cause drowsiness Mucinex DM for cough relief w/o drowsiness Use the Albuterol inhaler- 2 puffs every 4-6 hrs as needed for cough, chest tightness, or wheezing Call with any questions or concerns Hang in there! Happy Holidays!

## 2018-08-23 NOTE — Progress Notes (Signed)
   Subjective:    Patient ID: Angelica Lester, female    DOB: Oct 01, 1970, 47 y.o.   MRN: [id number]  HPI URI- sxs started Sunday night w/ cough, 'deep chest cough'.  Cough worsened as the day went on.  Temp to 99.8 last night.  Took TheraFlu and went to bed.  At 1am temp was 102.2  Denies body aches.  + chest soreness.  + HA.  + maxillary pressure.  No known sick contacts.  No ear pain but fullness.  No N/V.  No SOB but tightness.  Pt has hx of wheezing w/ URIs.     Review of Systems For ROS see HPI     Objective:   Physical Exam Vitals signs reviewed.  Constitutional:      General: She is not in acute distress.    Appearance: She is well-developed.  HENT:     Head: Normocephalic and atraumatic.     Right Ear: Tympanic membrane normal.     Left Ear: Tympanic membrane normal.     Nose: Mucosal edema and rhinorrhea present.     Right Sinus: Maxillary sinus tenderness present. No frontal sinus tenderness.     Left Sinus: Maxillary sinus tenderness present. No frontal sinus tenderness.     Mouth/Throat:     Pharynx: Uvula midline. Posterior oropharyngeal erythema present. No oropharyngeal exudate.  Eyes:     Conjunctiva/sclera: Conjunctivae normal.     Pupils: Pupils are equal, round, and reactive to light.  Neck:     Musculoskeletal: Normal range of motion and neck supple.  Cardiovascular:     Rate and Rhythm: Normal rate and regular rhythm.     Heart sounds: Normal heart sounds.  Pulmonary:     Effort: Pulmonary effort is normal. No respiratory distress.     Breath sounds: Normal breath sounds. No wheezing.     Comments: + barking cough Lymphadenopathy:     Cervical: No cervical adenopathy.           Assessment & Plan:  Bacterial sinusitis- pt's sxs and PE consistent w/ infxn.  Start abx.  Reviewed supportive care and red flags that should prompt return.  Pt expressed understanding and is in agreement w/ plan.   Cough w/ fever- likely due to drainage from sinus infxn.   Pt has hx of URI triggered bronchospasm.  Start Albuterol PRN and cough medicine.  Reviewed supportive care and red flags that should prompt return.  Pt expressed understanding and is in agreement w/ plan.

## 2018-08-26 ENCOUNTER — Encounter: Payer: Self-pay | Admitting: Family Medicine

## 2018-08-26 MED ORDER — PREDNISONE 10 MG PO TABS: ORAL_TABLET | ORAL | 0 refills | Status: DC

## 2018-08-29 ENCOUNTER — Encounter: Payer: Self-pay | Admitting: Family Medicine

## 2018-08-29 ENCOUNTER — Other Ambulatory Visit: Payer: Self-pay

## 2018-08-29 ENCOUNTER — Ambulatory Visit: Payer: Managed Care, Other (non HMO) | Admitting: Family Medicine

## 2018-08-29 VITALS — BP 132/80 | HR 68 | Temp 98.8°F | Resp 18 | Ht 65.5 in | Wt 220.2 lb

## 2018-08-29 DIAGNOSIS — B9689 Other specified bacterial agents as the cause of diseases classified elsewhere: Secondary | ICD-10-CM

## 2018-08-29 DIAGNOSIS — J208 Acute bronchitis due to other specified organisms: Secondary | ICD-10-CM

## 2018-08-29 DIAGNOSIS — J9801 Acute bronchospasm: Secondary | ICD-10-CM | POA: Diagnosis not present

## 2018-08-29 MED ORDER — ALBUTEROL SULFATE (2.5 MG/3ML) 0.083% IN NEBU
2.5000 mg | INHALATION_SOLUTION | Freq: Once | RESPIRATORY_TRACT | Status: AC
  Administered 2018-08-29: 2.5 mg via RESPIRATORY_TRACT

## 2018-08-29 MED ORDER — AZITHROMYCIN 250 MG PO TABS: ORAL_TABLET | ORAL | 0 refills | Status: DC

## 2018-08-29 MED ORDER — METHYLPREDNISOLONE ACETATE 80 MG/ML IJ SUSP
80.0000 mg | Freq: Once | INTRAMUSCULAR | Status: AC
  Administered 2018-08-29: 80 mg via INTRAMUSCULAR

## 2018-08-29 NOTE — Progress Notes (Signed)
Subjective  CC:  Chief Complaint  Patient presents with  . Cough    Getting worse is taking Doxycycline given by Dr Birdie Riddle.. Not coughing up anything    HPI: SUBJECTIVE:  Angelica Lester is a 47 y.o. female who was here last week for flu like sxs with cough and sinus sxs: treated for sinusitis with doxy; barking cough worsened so a low dose pred taper was ordered 3 days ago. She is here with persistent hacking barking painful cough. No sob. No audible wheeze. Sinus sxs are better. No more fevers or body aches but can't stop coughing. No GI sxs. Non smoker; no asthma.   Assessment  1. Acute bacterial bronchitis   2. Bronchospasm      Plan  Discussion:  Hacking harsh cough improved mildly with neb: depo-medrol IM given. Change to zpak per pt request. Supportive care and time. We discussed the use of mucolytic's, decongestants, antihistamines and antitussives as needed.  Tylenol or Advil are recommended if needed.  Follow up: Return if symptoms worsen or fail to improve.   No orders of the defined types were placed in this encounter.  Meds ordered this encounter  Medications  . methylPREDNISolone acetate (DEPO-MEDROL) injection 80 mg  . azithromycin (ZITHROMAX) 250 MG tablet    Sig: Take 2 tabs today, then 1 tab daily for 4 days    Dispense:  1 each    Refill:  0  . albuterol (PROVENTIL) (2.5 MG/3ML) 0.083% nebulizer solution 2.5 mg      I reviewed the patients updated PMH, FH, and SocHx.  Social History: Patient  reports that she has never smoked. She has never used smokeless tobacco. She reports current alcohol use of about 3.0 standard drinks of alcohol per week. She reports that she does not use drugs.  Patient Active Problem List   Diagnosis Date Noted  . Essential hypertension 03/12/2017    Priority: High  . 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    Priority: High  . Obesity (BMI 30-39.9) 03/09/2016    Priority: High  . Family history of colon cancer 09/27/2013    Priority: High  . GERD (gastroesophageal reflux disease) 09/27/2013    Priority: Medium  . Irritable bowel syndrome 08/28/2010    Priority: Medium  . Migraine headache 02/24/2010    Priority: Medium  . Perimenopausal 03/12/2017    Priority: Low  . Seasonal allergies 01/15/2014    Priority: Low  . 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 albuterol (PROVENTIL HFA;VENTOLIN HFA) 108 (90 Base) MCG/ACT inhaler Inhale 2 puffs into the lungs every 6 (six) hours as needed for wheezing or shortness of breath.  . calcium carbonate (TUMS) 500 MG chewable tablet Chew 2 tablets (400 mg of elemental calcium total) by mouth 2 (two) times daily.  . calcium-vitamin D (OSCAL WITH D) 250-125 MG-UNIT tablet Take 1 tablet by mouth daily.  Marland Kitchen doxycycline (VIBRA-TABS) 100 MG tablet Take 1 tablet (100 mg total) by mouth 2 (two) times daily.  . fluticasone (FLONASE) 50 MCG/ACT nasal spray Place 1 spray into both nostrils daily.  . hydrochlorothiazide (HYDRODIURIL) 25 MG tablet Take 1 tablet (25 mg total) by mouth daily.  Marland Kitchen ibuprofen (ADVIL,MOTRIN) 200 MG tablet Take 400 mg by mouth as needed for headache, mild pain or moderate  pain.   . montelukast (SINGULAIR) 10 MG tablet Take 1 tablet (10 mg total) by mouth daily.  . Multiple Vitamins-Minerals (MULTIVITAMIN WITH MINERALS) tablet Take 1 tablet by mouth daily.  . predniSONE (DELTASONE) 10 MG tablet 3 tabs x3 days and then 2 tabs x3 days and then 1 tab x3 days.  Take w/ food.  . SUMAtriptan (IMITREX) 100 MG tablet Take 100 mg by mouth every 2 (two) hours as needed for migraine. May repeat in 2 hours if headache persists or recurs.   Marland Kitchen SYNTHROID 150 MCG tablet Take 150 mcg by mouth daily.  . [DISCONTINUED]  guaiFENesin-codeine (ROBITUSSIN AC) 100-10 MG/5ML syrup Take 10 mLs by mouth 3 (three) times daily as needed for cough.    Objective  Vitals: BP 132/80   Pulse 68   Temp 98.8 F (37.1 C) (Oral)   Resp 18   Ht 5' 5.5" (1.664 m)   Wt 220 lb 3.2 oz (99.9 kg)   SpO2 98%   BMI 36.09 kg/m  General: no acute distress but harsh coughing present  Psych:  Alert and oriented, normal mood and affect HEENT:  Normocephalic, atraumatic, supple neck, moist mucous membranes, mildly erythematous pharynx without exudate, mild lymphadenopathy, supple neck Cardiovascular:  RRR without murmur. no edema Respiratory:  Good breath sounds bilaterally, CTAB but can't take in deep breath w/o coughing; mildly improved post albuterol neb x 1 in office.  Skin:  Warm, no rashes Neurologic:   Mental status is normal. normal gait  Commons side effects, risks, benefits, and alternatives for medications and treatment plan prescribed today were discussed, and the patient expressed understanding of the given instructions. Patient is instructed to call or message via MyChart if he/she has any questions or concerns regarding our treatment plan. No barriers to understanding were identified. We discussed Red Flag symptoms and signs in detail. Patient expressed understanding regarding what to do in case of urgent or emergency type symptoms.  Medication list was reconciled, printed and provided to the patient in AVS. Patient instructions and summary information was reviewed with the patient as documented in the AVS. This note was prepared with assistance of Dragon voice recognition software. Occasional wrong-word or sound-a-like substitutions may have occurred due to the inherent limitations of voice recognition software

## 2018-08-29 NOTE — Patient Instructions (Signed)
Please follow up if symptoms do not improve or as needed.   Use your inhaler every 4 hours as needed.

## 2018-10-18 ENCOUNTER — Other Ambulatory Visit: Payer: Self-pay | Admitting: Family Medicine

## 2018-11-01 ENCOUNTER — Encounter: Payer: Self-pay | Admitting: Family Medicine

## 2018-11-02 NOTE — Telephone Encounter (Signed)
Copied from [place] [id number]. Topic: Quick Communication - See Telephone Encounter >> Nov 01, 2018  4:31 PM Rayann Heman wrote: CRM for notification. See Telephone encounter for: 11/01/18. [place] from [place] called and stated that Billey Chang is put in as a specialist with [place]. [place] states that Dr Jonni Sanger needs to call Provider service unit to get fixed. Phone#[phone]. Pt has been charged a Astronomer. Please advise

## 2018-12-13 ENCOUNTER — Encounter: Payer: Self-pay | Admitting: *Deleted

## 2018-12-13 LAB — HM MAMMOGRAPHY

## 2018-12-21 ENCOUNTER — Ambulatory Visit (INDEPENDENT_AMBULATORY_CARE_PROVIDER_SITE_OTHER): Payer: Managed Care, Other (non HMO) | Admitting: Family Medicine

## 2018-12-21 ENCOUNTER — Encounter: Payer: Self-pay | Admitting: Family Medicine

## 2018-12-21 ENCOUNTER — Other Ambulatory Visit: Payer: Self-pay

## 2018-12-21 VITALS — Temp 97.5°F

## 2018-12-21 DIAGNOSIS — B029 Zoster without complications: Secondary | ICD-10-CM

## 2018-12-21 MED ORDER — HYDROCODONE-ACETAMINOPHEN 5-325 MG PO TABS: 1.0000 | ORAL_TABLET | Freq: Four times a day (QID) | ORAL | 0 refills | Status: DC | PRN

## 2018-12-21 MED ORDER — VALACYCLOVIR HCL 1 G PO TABS: 1000.0000 mg | ORAL_TABLET | Freq: Three times a day (TID) | ORAL | 0 refills | Status: DC

## 2018-12-21 NOTE — Progress Notes (Signed)
Virtual Visit via Video Note  Subjective  CC:  Chief Complaint  Patient presents with  . Possible Shingles    Painful, red, and blistered rash on lower right side of back.. Noticed 12/19/18    HPI:  I connected with Angelica Lester on 12/21/18 at  8:40 AM EDT by a video enabled telemedicine application and verified that I am speaking with the correct person using two identifiers. Location patient: Home Location provider: [employer], Office Persons participating in the virtual visit: Angelica Lester, Leamon Arnt, MD Lilli Light, [place] discussed the limitations of evaluation and management by telemedicine and the availability of in person appointments. The patient expressed understanding and agreed to proceed. . Patient is a 48 y.o. @GENDER @ who presents via video conferencing with the following concerns: o 2 days ago noticed some soreness on the right lower back described as a mild aching.  Nothing 12 hours she noted some blistering.  Now with constant pain, burning, making it difficult to sleep.  Pain is on the right side of her lower back with associated red blistering rash.  No drainage.  No fevers but she has had some chills.  Temperature has ranged to 97 and 98 consistently.  She has no associated nausea vomiting diarrhea paresis.  No other rashes. Assessment  1. Herpes zoster without complication      Plan   Shingles: Education and treatment options discussed in detail.  Start supportive care for the skin.  Monitor for signs of secondary infection.  Treat pain with Advil and hydrocodone.  She tolerates this well.  Valtrex 3 times daily for the next week.  Recheck in 1 week if not improving. I discussed the assessment and treatment plan with the patient. The patient was provided an opportunity to ask questions and all were answered. The patient agreed with the plan and demonstrated an understanding of the instructions.   The patient was advised to call  back or seek an in-person evaluation if the symptoms worsen or if the condition fails to improve as anticipated. Follow up: Return for as scheduled for blood pressure f/u.  01/05/2019  Meds ordered this encounter  Medications  . valACYclovir (VALTREX) 1000 MG tablet    Sig: Take 1 tablet (1,000 mg total) by mouth 3 (three) times daily.    Dispense:  21 tablet    Refill:  0  . HYDROcodone-acetaminophen (NORCO) 5-325 MG tablet    Sig: Take 1 tablet by mouth every 6 (six) hours as needed for moderate pain.    Dispense:  20 tablet    Refill:  0      I reviewed the patients updated PMH, FH, and SocHx.    Patient Active Problem List   Diagnosis Date Noted  . Essential hypertension 03/12/2017    Priority: High  . 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    Priority: High  . Obesity (BMI 30-39.9) 03/09/2016    Priority: High  . Family history of colon cancer 09/27/2013    Priority: High  . GERD (gastroesophageal reflux disease) 09/27/2013    Priority: Medium  . Irritable bowel syndrome 08/28/2010    Priority: Medium  . Migraine headache 02/24/2010    Priority: Medium  . Perimenopausal 03/12/2017    Priority: Low  . Seasonal allergies 01/15/2014    Priority: Low  . 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 calcium carbonate (TUMS) 500 MG chewable  tablet Chew 2 tablets (400 mg of elemental calcium total) by mouth 2 (two) times daily.  . calcium-vitamin D (OSCAL WITH D) 250-125 MG-UNIT tablet Take 1 tablet by mouth daily.  . fluticasone (FLONASE) 50 MCG/ACT nasal spray Place 1 spray into both nostrils daily.  . hydrochlorothiazide (HYDRODIURIL) 25 MG tablet TAKE 1 TABLET BY MOUTH DAILY  . ibuprofen (ADVIL,MOTRIN) 200 MG tablet Take 400 mg by mouth as needed for headache, mild pain or moderate pain.   . montelukast (SINGULAIR) 10 MG tablet Take 1 tablet (10 mg total) by mouth daily.  . Multiple Vitamins-Minerals (MULTIVITAMIN WITH MINERALS)  tablet Take 1 tablet by mouth daily.  . SUMAtriptan (IMITREX) 100 MG tablet Take 100 mg by mouth every 2 (two) hours as needed for migraine. May repeat in 2 hours if headache persists or recurs.   Marland Kitchen SYNTHROID 150 MCG tablet Take 150 mcg by mouth daily.    Allergies: Patient is allergic to fexofenadine and amoxicillin. Family History: Patient family history includes Alzheimer's disease in her paternal grandmother; Atrial fibrillation in her father and mother; COPD in her mother; Colon cancer in her paternal grandfather; Heart disease in her sister; Hyperlipidemia in her mother; Hypertension in her mother; Liver disease in her sister. Social History:  Patient  reports that she has never smoked. She has never used smokeless tobacco. She reports current alcohol use of about 3.0 standard drinks of alcohol per week. She reports that she does not use drugs.  Review of Systems: Constitutional: Negative for fever malaise or anorexia Cardiovascular: negative for chest pain Respiratory: negative for SOB or persistent cough Gastrointestinal: negative for abdominal pain  OBJECTIVE Vitals: Temp (!) 97.5 F (36.4 C) (Oral)  General: Appears mildly uncomfortable but nontoxic, A&Ox3 Skin: Right lower lumbar area with classic erythematous rash in a dermatomal pattern, blistering present.  Small lesion wrapping around anteriorly.  No other rashes.  No visible pus.  Leamon Arnt, MD

## 2018-12-21 NOTE — Patient Instructions (Addendum)
Please follow up as scheduled for your next visit with me: 01/05/2019 for HTN f/u.  If you have any questions or concerns, please don't hesitate to send me a message via MyChart or call the office at [phone]. Thank you for visiting with Angelica Lester today! It's our pleasure caring for you.   Shingles  Shingles, which is also known as herpes zoster, is an infection that causes a painful skin rash and fluid-filled blisters. It is caused by a virus. Shingles only develops in people who:  Have had chickenpox.  Have been given a medicine to protect against chickenpox (have been vaccinated). Shingles is rare in this group. What are the causes? Shingles is caused by varicella-zoster virus (VZV). This is the same virus that causes chickenpox. After a person is exposed to VZV, the virus stays in the body in an inactive (dormant) state. Shingles develops if the virus is reactivated. This can happen many years after the first (initial) exposure to VZV. It is not known what causes this virus to be reactivated. What increases the risk? People who have had chickenpox or received the chickenpox vaccine are at risk for shingles. Shingles infection is more common in people who:  Are older than age 56.  Have a weakened disease-fighting system (immune system), such as people with: ? HIV. ? AIDS. ? Cancer.  Are taking medicines that weaken the immune system, such as transplant medicines.  Are experiencing a lot of stress. What are the signs or symptoms? Early symptoms of this condition include itching, tingling, and pain in an area on your skin. Pain may be described as burning, stabbing, or throbbing. A few days or weeks after early symptoms start, a painful red rash appears. The rash is usually on one side of the body and has a band-like or belt-like pattern. The rash eventually turns into fluid-filled blisters that break open, change into scabs, and dry up in about 2-3 weeks. At any time during the  infection, you may also develop:  A fever.  Chills.  A headache.  An upset stomach. How is this diagnosed? This condition is diagnosed with a skin exam. Skin or fluid samples may be taken from the blisters before a diagnosis is made. These samples are examined under a microscope or sent to a lab for testing. How is this treated? The rash may last for several weeks. There is not a specific cure for this condition. Your health care provider will probably prescribe medicines to help you manage pain, recover more quickly, and avoid long-term problems. Medicines may include:  Antiviral drugs.  Anti-inflammatory drugs.  Pain medicines.  Anti-itching medicines (antihistamines). If the area involved is on your face, you may be referred to a specialist, such as an eye doctor (ophthalmologist) or an ear, nose, and throat (ENT) doctor (otolaryngologist) to help you avoid eye problems, chronic pain, or disability. Follow these instructions at home: Medicines  Take over-the-counter and prescription medicines only as told by your health care provider.  Apply an anti-itch cream or numbing cream to the affected area as told by your health care provider. Relieving itching and discomfort   Apply cold, wet cloths (cold compresses) to the area of the rash or blisters as told by your health care provider.  Cool baths can be soothing. Try adding baking soda or dry oatmeal to the water to reduce itching. Do not bathe in hot water. Blister and rash care  Keep your rash covered with a loose bandage (dressing). Wear loose-fitting clothing to  help ease the pain of material rubbing against the rash.  Keep your rash and blisters clean by washing the area with mild soap and cool water as told by your health care provider.  Check your rash every day for signs of infection. Check for: ? More redness, swelling, or pain. ? Fluid or blood. ? Warmth. ? Pus or a bad smell.  Do not scratch your rash or pick  at your blisters. To help avoid scratching: ? Keep your fingernails clean and cut short. ? Wear gloves or mittens while you sleep, if scratching is a problem. General instructions  Rest as told by your health care provider.  Keep all follow-up visits as told by your health care provider. This is important.  Wash your hands often with soap and water. If soap and water are not available, use hand sanitizer. Doing this lowers your chance of getting a bacterial skin infection.  Before your blisters change into scabs, your shingles infection can cause chickenpox in people who have never had it or have never been vaccinated against it. To prevent this from happening, avoid contact with other people, especially: ? Babies. ? Pregnant women. ? Children who have eczema. ? Elderly people who have transplants. ? People who have chronic illnesses, such as cancer or AIDS. Contact a health care provider if:  Your pain is not relieved with prescribed medicines.  Your pain does not get better after the rash heals.  You have signs of infection in the rash area, such as: ? More redness, swelling, or pain around the rash. ? Fluid or blood coming from the rash. ? The rash area feeling warm to the touch. ? Pus or a bad smell coming from the rash. Get help right away if:  The rash is on your face or nose.  You have facial pain, pain around your eye area, or loss of feeling on one side of your face.  You have difficulty seeing.  You have ear pain or have ringing in your ear.  You have a loss of taste.  Your condition gets worse. Summary  Shingles, which is also known as herpes zoster, is an infection that causes a painful skin rash and fluid-filled blisters.  This condition is diagnosed with a skin exam. Skin or fluid samples may be taken from the blisters and examined before the diagnosis is made.  Keep your rash covered with a loose bandage (dressing). Wear loose-fitting clothing to help  ease the pain of material rubbing against the rash.  Before your blisters change into scabs, your shingles infection can cause chickenpox in people who have never had it or have never been vaccinated against it. This information is not intended to replace advice given to you by your health care provider. Make sure you discuss any questions you have with your health care provider. Document Released: 08/24/2005 Document Revised: 04/28/2017 Document Reviewed: 04/28/2017 Elsevier Interactive Patient Education  2019 [employer].

## 2018-12-21 NOTE — Progress Notes (Signed)
I have discussed the procedure for the virtual visit with the patient who has given consent to proceed with assessment and treatment.   Tiara S Simmons, CMA     

## 2019-01-05 ENCOUNTER — Ambulatory Visit (INDEPENDENT_AMBULATORY_CARE_PROVIDER_SITE_OTHER): Payer: Managed Care, Other (non HMO) | Admitting: Family Medicine

## 2019-01-05 ENCOUNTER — Encounter: Payer: Self-pay | Admitting: Family Medicine

## 2019-01-05 ENCOUNTER — Other Ambulatory Visit: Payer: Self-pay

## 2019-01-05 VITALS — BP 133/104 | Wt 221.4 lb

## 2019-01-05 DIAGNOSIS — I10 Essential (primary) hypertension: Secondary | ICD-10-CM

## 2019-01-05 DIAGNOSIS — E669 Obesity, unspecified: Secondary | ICD-10-CM | POA: Diagnosis not present

## 2019-01-05 DIAGNOSIS — N951 Menopausal and female climacteric states: Secondary | ICD-10-CM

## 2019-01-05 DIAGNOSIS — F5102 Adjustment insomnia: Secondary | ICD-10-CM | POA: Diagnosis not present

## 2019-01-05 MED ORDER — ESCITALOPRAM OXALATE 10 MG PO TABS: 10.0000 mg | ORAL_TABLET | Freq: Every day | ORAL | 2 refills | Status: DC

## 2019-01-05 MED ORDER — LISINOPRIL-HYDROCHLOROTHIAZIDE 20-25 MG PO TABS: 1.0000 | ORAL_TABLET | Freq: Every day | ORAL | 3 refills | Status: DC

## 2019-01-05 NOTE — Assessment & Plan Note (Signed)
Very active and problematic for sleep. Discussed mgt strategies. At this time, pt defers HRT due to perceived risks. Will start lexapro 10 to help with sxs and also to help manage stress reaction and adjustment related insomnia.

## 2019-01-05 NOTE — Progress Notes (Signed)
I have discussed the procedure for the virtual visit with the patient who has given consent to proceed with assessment and treatment.   Angelica Lester Billy Rocco, CMA     

## 2019-01-05 NOTE — Assessment & Plan Note (Addendum)
Poorly controlled now, in part due to poor sleep and increased stress. Add Ace inhibitor to hctz and recheck in 4 weeks. Pt to continue home monitoring. No red flag sxs. Discussed low salt diet, weight loss, increasing exercise and improving sleep. Will recheck bmp when back in office after starting ace. Monitor for side effects.

## 2019-01-05 NOTE — Assessment & Plan Note (Signed)
Poor sleep in part due to stressors from pandemic, stay at home order, working from home, caring for elder HR family members, financial strain etc... no h/o mood disorder. Start lexapro to help - also will help hot flushes.

## 2019-01-05 NOTE — Assessment & Plan Note (Signed)
Discussed importance of weight loss for better bp control and to decrease CV risks. As well as adding exercise to reduce stress.

## 2019-01-05 NOTE — Progress Notes (Signed)
Virtual Visit via Video Note  Subjective  CC:  Chief Complaint  Patient presents with  . Hypertension     I connected with [name] on 01/05/19 at  8:40 AM EDT by a video enabled telemedicine application and verified that I am speaking with the correct person using two identifiers. Location patient: Home Location provider: [facility] [place] participating in the virtual visit: [name], Leamon Arnt, MD Lilli Light, [place] discussed the limitations of evaluation and management by telemedicine and the availability of in person appointments. The patient expressed understanding and agreed to proceed. HPI: Angelica Lester is a 48 y.o. female who was contacted today to address the problems listed above in the chief complaint/HTN f/u:   Hypertension f/u: Control is poor. Pt reports she is doing well. taking medications as instructed, no medication side effects noted, no TIAs, no chest pain on exertion, no dyspnea on exertion, no swelling of ankles. Checked twice at home and both times it has been elevated. No other sxs: admits to poor sleep and increased stress.  She denies adverse effects from his BP medications. Compliance with medication is good.   Poor sleep: awake 2-3 hours per night, due to moderate hot flushes and worry. Stressed. See below. No mood d/o. Neg phq2. Has vacation planned for next week that will help. Feels tired during the day.   BP Readings from Last 3 Encounters:  01/05/19 (!) 133/104  08/29/18 132/80  08/23/18 122/82   Wt Readings from Last 3 Encounters:  01/05/19 221 lb 6.4 oz (100.4 kg)  08/29/18 220 lb 3.2 oz (99.9 kg)  08/23/18 220 lb 4 oz (99.9 kg)    Lab Results  Component Value Date   CHOL 194 03/29/2018   CHOL 223 (H) 10/01/2017   Lab Results  Component Value Date   HDL 56.50 03/29/2018   HDL 57.50 10/01/2017   Lab Results  Component Value Date   LDLCALC 111 (H) 03/29/2018   Lab Results   Component Value Date   TRIG 131.0 03/29/2018   TRIG 222.0 (H) 10/01/2017   Lab Results  Component Value Date   CHOLHDL 3 03/29/2018   CHOLHDL 4 10/01/2017   Lab Results  Component Value Date   LDLDIRECT 137.0 10/01/2017   Lab Results  Component Value Date   CREATININE 0.78 03/29/2018   BUN 10 03/29/2018   NA 138 03/29/2018   K 3.5 03/29/2018   CL 98 03/29/2018   CO2 32 03/29/2018    The 10-year ASCVD risk score [name], et al., 2013) is: 1.5%   Values used to calculate the score:     Age: 25 years     Sex: Female     Is Non-Hispanic African American: No     Diabetic: No     Tobacco smoker: No     Systolic Blood Pressure: 885 mmHg     Is BP treated: Yes     HDL Cholesterol: 56.5 mg/dL     Total Cholesterol: 194 mg/dL  Assessment  1. Essential hypertension   2. Obesity (BMI 30-39.9)   3. Perimenopausal vasomotor symptoms   4. Adjustment insomnia      Plan   See below for problem based assessment and plan documentation   I discussed the assessment and treatment plan with the patient. The patient was provided an opportunity to ask questions and all were answered. The patient agreed with the plan  and demonstrated an understanding of the instructions.   The patient was advised to call back or seek an in-person evaluation if the symptoms worsen or if the condition fails to improve as anticipated. Follow up: Return in about 4 weeks (around 02/02/2019) for follow up Hypertension and menopause.  Visit date not found  Meds ordered this encounter  Medications  . lisinopril-hydrochlorothiazide (ZESTORETIC) 20-25 MG tablet    Sig: Take 1 tablet by mouth daily.    Dispense:  90 tablet    Refill:  3  . escitalopram (LEXAPRO) 10 MG tablet    Sig: Take 1 tablet (10 mg total) by mouth daily.    Dispense:  30 tablet    Refill:  2      I reviewed the patients updated PMH, FH, and SocHx.    Patient Active Problem List   Diagnosis Date Noted  . Essential  hypertension 03/12/2017    Priority: High  . 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    Priority: High  . Obesity (BMI 30-39.9) 03/09/2016    Priority: High  . Family history of colon cancer 09/27/2013    Priority: High  . GERD (gastroesophageal reflux disease) 09/27/2013    Priority: Medium  . Irritable bowel syndrome 08/28/2010    Priority: Medium  . Migraine headache 02/24/2010    Priority: Medium  . Perimenopausal vasomotor symptoms 03/12/2017    Priority: Low  . Seasonal allergies 01/15/2014    Priority: Low  . 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 Adjustment insomnia 01/05/2019   Current Meds  Medication Sig  . calcium carbonate (TUMS) 500 MG chewable tablet Chew 2 tablets (400 mg of elemental calcium total) by mouth 2 (two) times daily.  . calcium-vitamin D (OSCAL WITH D) 250-125 MG-UNIT tablet Take 1 tablet by mouth daily.  . fluticasone (FLONASE) 50 MCG/ACT nasal spray Place 1 spray into both nostrils daily.  . hydrochlorothiazide (HYDRODIURIL) 25 MG tablet TAKE 1 TABLET BY MOUTH DAILY  . ibuprofen (ADVIL,MOTRIN) 200 MG tablet Take 400 mg by mouth as needed for headache, mild pain or moderate pain.   . montelukast (SINGULAIR) 10 MG tablet Take 1 tablet (10 mg total) by mouth daily.  . Multiple Vitamins-Minerals (MULTIVITAMIN WITH MINERALS) tablet Take 1 tablet by mouth daily.  . SUMAtriptan (IMITREX) 100 MG tablet Take 100 mg by mouth every 2 (two) hours as needed for migraine. May repeat in 2 hours if headache persists or recurs.   Marland Kitchen SYNTHROID 150 MCG tablet Take 150 mcg by mouth daily.  . [DISCONTINUED] albuterol (PROVENTIL HFA;VENTOLIN HFA) 108 (90 Base) MCG/ACT inhaler Inhale 2 puffs into the lungs every 6 (six) hours as needed for wheezing or shortness of breath.  . [DISCONTINUED] valACYclovir (VALTREX) 1000 MG tablet Take 1 tablet (1,000 mg total) by mouth 3 (three) times daily.    Allergies: Patient is allergic to fexofenadine and amoxicillin. Family  History: Patient family history includes Alzheimer's disease in her paternal grandmother; Atrial fibrillation in her father and mother; COPD in her mother; Colon cancer in her paternal grandfather; Heart disease in her sister; Hyperlipidemia in her mother; Hypertension in her mother; Liver disease in her sister. Social History:  Patient  reports that she has never smoked. She has never used smokeless tobacco. She reports current alcohol use of about 3.0 standard drinks of alcohol per week. She reports that she does not use drugs.  Review of Systems: Constitutional: Negative for fever malaise or anorexia Cardiovascular: negative for chest pain Respiratory: negative  for SOB or persistent cough Gastrointestinal: negative for abdominal pain  OBJECTIVE Vitals: BP (!) 133/104   Wt 221 lb 6.4 oz (100.4 kg)   BMI 36.28 kg/m  General: no acute distress , A&Ox3, appears tired, almost tearful  Leamon Arnt, MD

## 2019-01-05 NOTE — Patient Instructions (Signed)
Please return in 4 weeks for follow up of your hypertension and to f/u on sleep and hot flushes.   If you have any questions or concerns, please don't hesitate to send me a message via MyChart or call the office at [phone]. Thank you for visiting with Angelica Lester today! It's our pleasure caring for you.   Menopause Menopause is the normal time of life when menstrual periods stop completely. It is usually confirmed by 12 months without a menstrual period. The transition to menopause (perimenopause) most often happens between the ages of 5 and 40. During perimenopause, hormone levels change in your body, which can cause symptoms and affect your health. Menopause may increase your risk for:  Loss of bone (osteoporosis), which causes bone breaks (fractures).  Depression.  Hardening and narrowing of the arteries (atherosclerosis), which can cause heart attacks and strokes. What are the causes? This condition is usually caused by a natural change in hormone levels that happens as you get older. The condition may also be caused by surgery to remove both ovaries (bilateral oophorectomy). What increases the risk? This condition is more likely to start at an earlier age if you have certain medical conditions or treatments, including:  A tumor of the pituitary gland in the brain.  A disease that affects the ovaries and hormone production.  Radiation treatment for cancer.  Certain cancer treatments, such as chemotherapy or hormone (anti-estrogen) therapy.  Heavy smoking and excessive alcohol use.  Family history of early menopause. This condition is also more likely to develop earlier in women who are very thin. What are the signs or symptoms? Symptoms of this condition include:  Hot flashes.  Irregular menstrual periods.  Night sweats.  Changes in feelings about sex. This could be a decrease in sex drive or an increased comfort around your sexuality.  Vaginal dryness and thinning of the  vaginal walls. This may cause painful intercourse.  Dryness of the skin and development of wrinkles.  Headaches.  Problems sleeping (insomnia).  Mood swings or irritability.  Memory problems.  Weight gain.  Hair growth on the face and chest.  Bladder infections or problems with urinating. How is this diagnosed? This condition is diagnosed based on your medical history, a physical exam, your age, your menstrual history, and your symptoms. Hormone tests may also be done. How is this treated? In some cases, no treatment is needed. You and your health care provider should make a decision together about whether treatment is necessary. Treatment will be based on your individual condition and preferences. Treatment for this condition focuses on managing symptoms. Treatment may include:  Menopausal hormone therapy (MHT).  Medicines to treat specific symptoms or complications.  Acupuncture.  Vitamin or herbal supplements. Before starting treatment, make sure to let your health care provider know if you have a personal or family history of:  Heart disease.  Breast cancer.  Blood clots.  Diabetes.  Osteoporosis. Follow these instructions at home: Lifestyle  Do not use any products that contain nicotine or tobacco, such as cigarettes and e-cigarettes. If you need help quitting, ask your health care provider.  Get at least 30 minutes of physical activity on 5 or more days each week.  Avoid alcoholic and caffeinated beverages, as well as spicy foods. This may help prevent hot flashes.  Get 7-8 hours of sleep each night.  If you have hot flashes, try: ? Dressing in layers. ? Avoiding things that may trigger hot flashes, such as spicy food, warm places, or  stress. ? Taking slow, deep breaths when a hot flash starts. ? Keeping a fan in your home and office.  Find ways to manage stress, such as deep breathing, meditation, or journaling.  Consider going to group therapy with  other women who are having menopause symptoms. Ask your health care provider about recommended group therapy meetings. Eating and drinking  Eat a healthy, balanced diet that contains whole grains, lean protein, low-fat dairy, and plenty of fruits and vegetables.  Your health care provider may recommend adding more soy to your diet. Foods that contain soy include tofu, tempeh, and soy milk.  Eat plenty of foods that contain calcium and vitamin D for bone health. Items that are rich in calcium include low-fat milk, yogurt, beans, almonds, sardines, broccoli, and kale. Medicines  Take over-the-counter and prescription medicines only as told by your health care provider.  Talk with your health care provider before starting any herbal supplements. If prescribed, take vitamins and supplements as told by your health care provider. These may include: ? Calcium. Women age 1 and older should get 1,200 mg (milligrams) of calcium every day. ? Vitamin D. Women need 600-800 International Units of vitamin D each day. ? Vitamins B12 and B6. Aim for 50 micrograms of B12 and 1.5 mg of B6 each day. General instructions  Keep track of your menstrual periods, including: ? When they occur. ? How heavy they are and how long they last. ? How much time passes between periods.  Keep track of your symptoms, noting when they start, how often you have them, and how long they last.  Use vaginal lubricants or moisturizers to help with vaginal dryness and improve comfort during sex.  Keep all follow-up visits as told by your health care provider. This is important. This includes any group therapy or counseling. Contact a health care provider if:  You are still having menstrual periods after age 57.  You have pain during sex.  You have not had a period for 12 months and you develop vaginal bleeding. Get help right away if:  You have: ? Severe depression. ? Excessive vaginal bleeding. ? Pain when you urinate.  ? A fast or irregular heart beat (palpitations). ? Severe headaches. ? Abdomen (abdominal) pain or severe indigestion.  You fell and you think you have a broken bone.  You develop leg or chest pain.  You develop vision problems.  You feel a lump in your breast. Summary  Menopause is the normal time of life when menstrual periods stop completely. It is usually confirmed by 12 months without a menstrual period.  The transition to menopause (perimenopause) most often happens between the ages of 50 and 60.  Symptoms can be managed through medicines, lifestyle changes, and complementary therapies such as acupuncture.  Eat a balanced diet that is rich in nutrients to promote bone health and heart health and to manage symptoms during menopause. This information is not intended to replace advice given to you by your health care provider. Make sure you discuss any questions you have with your health care provider. Document Released: 11/14/2003 Document Revised: 09/26/2016 Document Reviewed: 09/26/2016 Elsevier Interactive Patient Education  2019 [employer].

## 2019-01-16 ENCOUNTER — Encounter: Payer: Self-pay | Admitting: Family Medicine

## 2019-02-02 ENCOUNTER — Other Ambulatory Visit: Payer: Self-pay

## 2019-02-02 ENCOUNTER — Encounter: Payer: Self-pay | Admitting: Family Medicine

## 2019-02-02 ENCOUNTER — Ambulatory Visit (INDEPENDENT_AMBULATORY_CARE_PROVIDER_SITE_OTHER): Payer: Managed Care, Other (non HMO) | Admitting: Family Medicine

## 2019-02-02 VITALS — BP 111/85 | HR 80 | Wt 218.0 lb

## 2019-02-02 DIAGNOSIS — F5102 Adjustment insomnia: Secondary | ICD-10-CM | POA: Diagnosis not present

## 2019-02-02 DIAGNOSIS — K219 Gastro-esophageal reflux disease without esophagitis: Secondary | ICD-10-CM | POA: Diagnosis not present

## 2019-02-02 DIAGNOSIS — N951 Menopausal and female climacteric states: Secondary | ICD-10-CM | POA: Diagnosis not present

## 2019-02-02 DIAGNOSIS — I10 Essential (primary) hypertension: Secondary | ICD-10-CM

## 2019-02-02 NOTE — Patient Instructions (Signed)
Please return in July for your annual complete physical; please come fasting.   If you have any questions or concerns, please don't hesitate to send me a message via MyChart or call the office at [phone]. Thank you for visiting with [place] today! It's our pleasure caring for you.

## 2019-02-02 NOTE — Assessment & Plan Note (Signed)
Now well controlled on HCTZ with lisinopril.  Recheck renal function and electrolytes at next visit.

## 2019-02-02 NOTE — Assessment & Plan Note (Signed)
Sleep is much better with controlled hot flashes and decreased worry from Lexapro.

## 2019-02-02 NOTE — Assessment & Plan Note (Signed)
Mildly active and may be worsened due to stress or Lexapro.  Take Lexapro in the morning with food.  If persists, restart PPI.

## 2019-02-02 NOTE — Assessment & Plan Note (Signed)
Excellent response to Lexapro for hot flashes.  Continue for the next 6 to 61-month and reevaluate

## 2019-02-02 NOTE — Progress Notes (Signed)
Virtual Visit via Video Note  Subjective  CC:  Chief Complaint  Patient presents with  . Hypertension    BPs have improved  . Menopause    I connected with Angelica Lester on 02/02/19 at  8:40 AM EDT by a video enabled telemedicine application and verified that I am speaking with the correct person using two identifiers. Location patient: Home Location provider: [facility] [place], Office Persons participating in the virtual visit: Angelica Lester, Leamon Arnt, MD Lilli Light, [place] discussed the limitations of evaluation and management by telemedicine and the availability of in person appointments. The patient expressed understanding and agreed to proceed. HPI: Angelica Lester is a 48 y.o. female who was contacted today to address the problems listed above in the chief complaint. . Hypertension follow-up: Added lisinopril to HCTZ last month due to poorly controlled blood pressure readings.  She is had an excellent response.  Blood pressure at the plastic surgeon's office yesterday was 120/78.  She is tolerating medicines well.  No adverse effects.  No chest pain or shortness of breath.  She is due for complete physical in July and will get her lab work at that time. Marland Kitchen Perimenopausal vasomotor changes have responded very well to Lexapro.  No longer having night sweats.  Sleep is much improved.  Still with occasional mild flushing but minimal. . Adjustment insomnia/anxiety stress reaction: Also doing much better.  Lexapro has decreased irritability and worry.  She reports minor reflux symptoms with Lexapro but no other adverse effects.  Feels more rested, mood is improved.  Feels happier.  Enjoying things more. Assessment  1. Essential hypertension   2. Perimenopausal vasomotor symptoms   3. Adjustment insomnia      Plan   See below for problem based assessment and plan documentation  I discussed the assessment and treatment plan with the patient. The  patient was provided an opportunity to ask questions and all were answered. The patient agreed with the plan and demonstrated an understanding of the instructions.   The patient was advised to call back or seek an in-person evaluation if the symptoms worsen or if the condition fails to improve as anticipated. Follow up: July for cpe with labs   Visit date not found      I reviewed the patients updated PMH, FH, and SocHx.    Patient Active Problem List   Diagnosis Date Noted  . Essential hypertension 03/12/2017    Priority: High  . 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    Priority: High  . Obesity (BMI 30-39.9) 03/09/2016    Priority: High  . Family history of colon cancer 09/27/2013    Priority: High  . GERD (gastroesophageal reflux disease) 09/27/2013    Priority: Medium  . Irritable bowel syndrome 08/28/2010    Priority: Medium  . Migraine headache 02/24/2010    Priority: Medium  . Perimenopausal vasomotor symptoms 03/12/2017    Priority: Low  . Seasonal allergies 01/15/2014    Priority: Low  . 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 Adjustment insomnia 01/05/2019   Current Meds  Medication Sig  . calcium-vitamin D (OSCAL WITH D) 250-125 MG-UNIT tablet Take 1 tablet by mouth daily.  Marland Kitchen escitalopram (LEXAPRO) 10 MG tablet Take 1 tablet (10 mg total) by mouth daily.  . fluticasone (FLONASE) 50 MCG/ACT nasal spray Place 1 spray into both nostrils daily.  Marland Kitchen ibuprofen (ADVIL,MOTRIN) 200 MG tablet Take 400 mg  by mouth as needed for headache, mild pain or moderate pain.   Marland Kitchen lisinopril-hydrochlorothiazide (ZESTORETIC) 20-25 MG tablet Take 1 tablet by mouth daily.  . montelukast (SINGULAIR) 10 MG tablet Take 1 tablet (10 mg total) by mouth daily.  . Multiple Vitamins-Minerals (MULTIVITAMIN WITH MINERALS) tablet Take 1 tablet by mouth daily.  . SUMAtriptan (IMITREX) 100 MG tablet Take 100 mg by mouth every 2 (two) hours as needed for migraine. May repeat in 2 hours if  headache persists or recurs.   Marland Kitchen SYNTHROID 150 MCG tablet Take 150 mcg by mouth daily.  . [DISCONTINUED] calcium carbonate (TUMS) 500 MG chewable tablet Chew 2 tablets (400 mg of elemental calcium total) by mouth 2 (two) times daily.    Allergies: Patient is allergic to fexofenadine and amoxicillin. Family History: Patient family history includes Alzheimer's disease in her paternal grandmother; Atrial fibrillation in her father and mother; COPD in her mother; Colon cancer in her paternal grandfather; Heart disease in her sister; Hyperlipidemia in her mother; Hypertension in her mother; Liver disease in her sister. Social History:  Patient  reports that she has never smoked. She has never used smokeless tobacco. She reports current alcohol use of about 3.0 standard drinks of alcohol per week. She reports that she does not use drugs.  Review of Systems: Constitutional: Negative for fever malaise or anorexia Cardiovascular: negative for chest pain Respiratory: negative for SOB or persistent cough Gastrointestinal: negative for abdominal pain  OBJECTIVE Vitals: BP 111/85   Pulse 80   Wt 218 lb (98.9 kg)   BMI 35.73 kg/m  General: no acute distress , A&Ox3 Mood: Happy, normal affect  Leamon Arnt, MD

## 2019-04-05 ENCOUNTER — Other Ambulatory Visit: Payer: Self-pay

## 2019-04-05 ENCOUNTER — Encounter: Payer: Self-pay | Admitting: Family Medicine

## 2019-04-05 ENCOUNTER — Ambulatory Visit (INDEPENDENT_AMBULATORY_CARE_PROVIDER_SITE_OTHER): Payer: Managed Care, Other (non HMO) | Admitting: Family Medicine

## 2019-04-05 VITALS — BP 124/78 | HR 79 | Temp 98.3°F | Resp 16 | Ht 65.5 in | Wt 225.2 lb

## 2019-04-05 DIAGNOSIS — E890 Postprocedural hypothyroidism: Secondary | ICD-10-CM

## 2019-04-05 DIAGNOSIS — Z800 Family history of malignant neoplasm of digestive organs: Secondary | ICD-10-CM

## 2019-04-05 DIAGNOSIS — N951 Menopausal and female climacteric states: Secondary | ICD-10-CM

## 2019-04-05 DIAGNOSIS — I10 Essential (primary) hypertension: Secondary | ICD-10-CM | POA: Diagnosis not present

## 2019-04-05 DIAGNOSIS — Z0000 Encounter for general adult medical examination without abnormal findings: Secondary | ICD-10-CM

## 2019-04-05 DIAGNOSIS — K588 Other irritable bowel syndrome: Secondary | ICD-10-CM

## 2019-04-05 LAB — CBC WITH DIFFERENTIAL/PLATELET
Basophils Absolute: 0.1 10*3/uL (ref 0.0–0.1)
Basophils Relative: 1.2 % (ref 0.0–3.0)
Eosinophils Absolute: 0.1 10*3/uL (ref 0.0–0.7)
Eosinophils Relative: 2.3 % (ref 0.0–5.0)
HCT: 40.6 % (ref 36.0–46.0)
Hemoglobin: 13.9 g/dL (ref 12.0–15.0)
Lymphocytes Relative: 22.8 % (ref 12.0–46.0)
Lymphs Abs: 1.5 10*3/uL (ref 0.7–4.0)
MCHC: 34.3 g/dL (ref 30.0–36.0)
MCV: 88.3 fl (ref 78.0–100.0)
Monocytes Absolute: 0.4 10*3/uL (ref 0.1–1.0)
Monocytes Relative: 6.1 % (ref 3.0–12.0)
Neutro Abs: 4.3 10*3/uL (ref 1.4–7.7)
Neutrophils Relative %: 67.6 % (ref 43.0–77.0)
Platelets: 323 10*3/uL (ref 150.0–400.0)
RBC: 4.59 Mil/uL (ref 3.87–5.11)
RDW: 14.1 % (ref 11.5–15.5)
WBC: 6.4 10*3/uL (ref 4.0–10.5)

## 2019-04-05 LAB — COMPREHENSIVE METABOLIC PANEL
ALT: 18 U/L (ref 0–35)
AST: 18 U/L (ref 0–37)
Albumin: 4.4 g/dL (ref 3.5–5.2)
Alkaline Phosphatase: 84 U/L (ref 39–117)
BUN: 8 mg/dL (ref 6–23)
CO2: 29 mEq/L (ref 19–32)
Calcium: 9.5 mg/dL (ref 8.4–10.5)
Chloride: 94 mEq/L — ABNORMAL LOW (ref 96–112)
Creatinine, Ser: 0.65 mg/dL (ref 0.40–1.20)
GFR: 96.54 mL/min (ref >60.00)
Glucose, Bld: 95 mg/dL (ref 70–99)
Potassium: 3.9 mEq/L (ref 3.5–5.1)
Sodium: 132 mEq/L — ABNORMAL LOW (ref 135–145)
Total Bilirubin: 0.6 mg/dL (ref 0.2–1.2)
Total Protein: 6.7 g/dL (ref 6.0–8.3)

## 2019-04-05 LAB — LIPID PANEL
Cholesterol: 209 mg/dL — ABNORMAL HIGH (ref 0–200)
HDL: 57.4 mg/dL (ref >39.00)
LDL Cholesterol: 112 mg/dL — ABNORMAL HIGH (ref 0–99)
NonHDL: 151.75
Total CHOL/HDL Ratio: 4
Triglycerides: 198 mg/dL — ABNORMAL HIGH (ref 0.0–149.0)
VLDL: 39.6 mg/dL (ref 0.0–40.0)

## 2019-04-05 LAB — TSH: TSH: 0.93 u[IU]/mL (ref 0.35–4.50)

## 2019-04-05 MED ORDER — ESCITALOPRAM OXALATE 10 MG PO TABS: 10.0000 mg | ORAL_TABLET | Freq: Every day | ORAL | 3 refills | Status: DC

## 2019-04-05 NOTE — Progress Notes (Signed)
Subjective  Chief Complaint  Patient presents with  . Annual Exam    She is fasting  . Hypertension    HPI: Angelica Lester is a 48 y.o. female who presents to [facility] [place] today for a Female Wellness Visit. She also has the concerns and/or needs as listed above in the chief complaint. These will be addressed in addition to the Health Maintenance Visit.   Wellness Visit: annual visit with health maintenance review and exam without Pap   Health maintenance: Cervical cancer screening up-to-date.  Mammogram up-to-date.  Reports colon cancer screens are up-to-date.  She has had 3 colonoscopies due to her family history of colon cancer.  Sees Dr. Collene Mares. Chronic disease f/u and/or acute problem visit: (deemed necessary to be done in addition to the wellness visit):  Hypertension is well controlled.Feeling well. Taking medications w/o adverse effects. No symptoms of CHF, angina; no palpitations, sob, cp or lower extremity edema. Compliant with meds.   Hypothyroidism on supplements without symptoms of low or high thyroid.  Has been well controlled.  Due for recheck  Perimenopausal vasomotor symptoms are improved on Lexapro.  She has had 1 menstrual cycle in the last year.  IBS is an active.  Obesity: Continues to wish to lose weight.  Struggles with diet as she is single and tends to be a poor planner therefore has difficulty making good food choices.  She started some weight training.  Assessment  1. Annual physical exam   2. Essential hypertension   3. Family history of colon cancer   4. Postoperative hypothyroidism   5. Other irritable bowel syndrome   6. Perimenopausal vasomotor symptoms      Plan  Female Wellness Visit:  Age appropriate Health Maintenance and Prevention measures were discussed with patient. Included topics are cancer screening recommendations, ways to keep healthy (see AVS) including dietary and exercise recommendations, regular eye and  dental care, use of seat belts, and avoidance of moderate alcohol use and tobacco use.  Cancer screenings are up-to-date  BMI: discussed patient's BMI and encouraged positive lifestyle modifications to help get to or maintain a target BMI.  Discussed increasing plant-based foods in her diet increasing exercise  HM needs and immunizations were addressed and ordered. See below for orders. See HM and immunization section for updates.  Routine labs and screening tests ordered including cmp, cbc and lipids where appropriate.  Discussed recommendations regarding Vit D and calcium supplementation (see AVS)  Chronic disease management visit and/or acute problem visit:  Hypertension is well controlled, recheck renal and electrolytes, recheck lipids  Perimenopausal vasomotor symptoms improved on Lexapro.  Continue.  Hypothyroidism, recheck levels today.  Has been well controlled. Follow up: No follow-ups on file.  Orders Placed This Encounter  Procedures  . CBC with Differential/Platelet  . Comprehensive metabolic panel  . Lipid panel  . TSH   No orders of the defined types were placed in this encounter.     Lifestyle: Body mass index is 36.91 kg/m. Wt Readings from Last 3 Encounters:  04/05/19 225 lb 3.2 oz (102.2 kg)  02/02/19 218 lb (98.9 kg)  01/05/19 221 lb 6.4 oz (100.4 kg)    Patient Active Problem List   Diagnosis Date Noted  . Essential hypertension 03/12/2017    Priority: High  . 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 Obesity (BMI 30-39.9) 03/09/2016  Priority: High  . Family history of colon cancer 09/27/2013    Priority: High  . GERD (gastroesophageal reflux disease) 09/27/2013    Priority: Medium  . Irritable bowel syndrome 08/28/2010    Priority: Medium    Overview:  Constipation predominant   . Migraine headache 02/24/2010    Priority: Medium  .  Perimenopausal vasomotor symptoms 03/12/2017    Priority: Low  . Seasonal allergies 01/15/2014    Priority: Low  . 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 Adjustment insomnia 01/05/2019   Health Maintenance  Topic Date Due  . INFLUENZA VACCINE  04/08/2019  . TETANUS/TDAP  03/08/2022  . PAP SMEAR-Modifier  03/15/2022  . HIV Screening  Completed   Immunization History  Administered Date(s) Administered  . Influenza Split 06/25/2011  . Influenza, Seasonal, Injecte, Preservative Fre 07/09/2015  . Influenza,inj,Quad PF,6+ Mos 06/21/2014  . Influenza-Unspecified 06/07/2017  . Tdap 09/08/2003, 03/08/2012   We updated and reviewed the patient's past history in detail and it is documented below. Allergies: Patient is allergic to fexofenadine and amoxicillin. 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 Past Surgical History Patient  has a past surgical history that includes Total thyroidectomy (10/19/2016); Mohs surgery (Right); Dilation and curettage of uterus (X 1); Thyroidectomy (N/A, 10/19/2016); Laparoscopic cholecystectomy (2005); and Breast surgery (12/14/2017). Family History: Patient family history includes Alzheimer's disease in her paternal grandmother; Atrial fibrillation in her father and mother; COPD in her mother; Colon cancer in her paternal grandfather; Heart disease in her sister; Hyperlipidemia in her mother; Hypertension in her mother; Liver disease in her sister. Social History:  Patient  reports that she has never smoked. She has never used smokeless tobacco. She reports current alcohol use of about 3.0 standard drinks of alcohol per week. She reports that she does not use  drugs.  Review of Systems: Constitutional: negative for fever or malaise Ophthalmic: negative for photophobia, double vision or loss of vision Cardiovascular: negative for chest pain, dyspnea on exertion, or new LE swelling Respiratory: negative for SOB or persistent cough Gastrointestinal: negative for abdominal pain, change in bowel habits or melena Genitourinary: negative for dysuria or gross hematuria, no abnormal uterine bleeding or disharge Musculoskeletal: negative for new gait disturbance or muscular weakness Integumentary: negative for new or persistent rashes, no breast lumps Neurological: negative for TIA or stroke symptoms Psychiatric: negative for SI or delusions Allergic/Immunologic: negative for hives  Patient Care Team    Relationship Specialty Notifications Start End  Leamon Arnt, MD PCP - General Family Medicine  10/12/16   Madelin Rear, MD Consulting Physician Endocrinology  10/01/17     Objective  Vitals: BP 124/78   Pulse 79   Temp 98.3 F (36.8 C) (Oral)   Resp 16   Ht 5' 5.5" (1.664 m)   Wt 225 lb 3.2 oz (102.2 kg)   SpO2 96%   BMI 36.91 kg/m  General:  Well developed, well nourished, no acute distress  Psych:  Alert and orientedx3,normal mood and affect HEENT:  Normocephalic, atraumatic, non-icteric sclera, PERRL, oropharynx is clear without mass or exudate, supple neck without adenopathy, mass or thyromegaly Cardiovascular:  Normal S1, S2, RRR without gallop, rub or murmur, nondisplaced PMI Respiratory:  Good breath sounds bilaterally, CTAB with normal respiratory effort Gastrointestinal: normal bowel sounds, soft,  non-tender, no noted masses. No HSM MSK: no deformities, contusions. Joints are without erythema or swelling. Spine and CVA region are nontender Skin:  Warm, no rashes or suspicious lesions noted Neurologic:    Mental status is normal. CN 2-11 are normal. Gross motor and sensory exams are normal. Normal gait. No tremor Breast Exam: No  skin retraction or nipple discharge is appreciated in either breast.  Postsurgical scars present after breast reduction, bilateral scar tissue present no axillary adenopathy. Fibrocystic changes are not noted    Commons side effects, risks, benefits, and alternatives for medications and treatment plan prescribed today were discussed, and the patient expressed understanding of the given instructions. Patient is instructed to call or message via MyChart if he/she has any questions or concerns regarding our treatment plan. No barriers to understanding were identified. We discussed Red Flag symptoms and signs in detail. Patient expressed understanding regarding what to do in case of urgent or emergency type symptoms.   Medication list was reconciled, printed and provided to the patient in AVS. Patient instructions and summary information was reviewed with the patient as documented in the AVS. This note was prepared with assistance of Dragon voice recognition software. Occasional wrong-word or sound-a-like substitutions may have occurred due to the inherent limitations of voice recognition software

## 2019-04-05 NOTE — Patient Instructions (Signed)
Please return in 6 months for follow up of your hypertension.  Check out Engine 2 7-day rescue challenge.   If you have any questions or concerns, please don't hesitate to send me a message via MyChart or call the office at [phone]. Thank you for visiting with [place] today! It's our pleasure caring for you.   Preventive Care 14-50 Years Old, Female Preventive care refers to visits with your health care provider and lifestyle choices that can promote health and wellness. This includes:  A yearly physical exam. This may also be called an annual well check.  Regular dental visits and eye exams.  Immunizations.  Screening for certain conditions.  Healthy lifestyle choices, such as eating a healthy diet, getting regular exercise, not using drugs or products that contain nicotine and tobacco, and limiting alcohol use. What can I expect for my preventive care visit? Physical exam Your health care provider will check your:  Height and weight. This may be used to calculate body mass index (BMI), which tells if you are at a healthy weight.  Heart rate and blood pressure.  Skin for abnormal spots. Counseling Your health care provider may ask you questions about your:  Alcohol, tobacco, and drug use.  Emotional well-being.  Home and relationship well-being.  Sexual activity.  Eating habits.  Work and work Statistician.  Method of birth control.  Menstrual cycle.  Pregnancy history. What immunizations do I need?  Influenza (flu) vaccine  This is recommended every year. Tetanus, diphtheria, and pertussis (Tdap) vaccine  You may need a Td booster every 10 years. Varicella (chickenpox) vaccine  You may need this if you have not been vaccinated. Zoster (shingles) vaccine  You may need this after age 56. Measles, mumps, and rubella (MMR) vaccine  You may need at least one dose of MMR if you were born in 1957 or later. You may also need a second dose. Pneumococcal  conjugate (PCV13) vaccine  You may need this if you have certain conditions and were not previously vaccinated. Pneumococcal polysaccharide (PPSV23) vaccine  You may need one or two doses if you smoke cigarettes or if you have certain conditions. Meningococcal conjugate (MenACWY) vaccine  You may need this if you have certain conditions. Hepatitis A vaccine  You may need this if you have certain conditions or if you travel or work in places where you may be exposed to hepatitis A. Hepatitis B vaccine  You may need this if you have certain conditions or if you travel or work in places where you may be exposed to hepatitis B. Haemophilus influenzae type b (Hib) vaccine  You may need this if you have certain conditions. Human papillomavirus (HPV) vaccine  If recommended by your health care provider, you may need three doses over 6 months. You may receive vaccines as individual doses or as more than one vaccine together in one shot (combination vaccines). Talk with your health care provider about the risks and benefits of combination vaccines. What tests do I need? Blood tests  Lipid and cholesterol levels. These may be checked every 5 years, or more frequently if you are over 46 years old.  Hepatitis C test.  Hepatitis B test. Screening  Lung cancer screening. You may have this screening every year starting at age 25 if you have a 30-pack-year history of smoking and currently smoke or have quit within the past 15 years.  Colorectal cancer screening. All adults should have this screening starting at age 75 and continuing until age  29. Your health care provider may recommend screening at age 28 if you are at increased risk. You will have tests every 1-10 years, depending on your results and the type of screening test.  Diabetes screening. This is done by checking your blood sugar (glucose) after you have not eaten for a while (fasting). You may have this done every 1-3 years.   Mammogram. This may be done every 1-2 years. Talk with your health care provider about when you should start having regular mammograms. This may depend on whether you have a family history of breast cancer.  BRCA-related cancer screening. This may be done if you have a family history of breast, ovarian, tubal, or peritoneal cancers.  Pelvic exam and Pap test. This may be done every 3 years starting at age 45. Starting at age 17, this may be done every 5 years if you have a Pap test in combination with an HPV test. Other tests  Sexually transmitted disease (STD) testing.  Bone density scan. This is done to screen for osteoporosis. You may have this scan if you are at high risk for osteoporosis. Follow these instructions at home: Eating and drinking  Eat a diet that includes fresh fruits and vegetables, whole grains, lean protein, and low-fat dairy.  Take vitamin and mineral supplements as recommended by your health care provider.  Do not drink alcohol if: ? Your health care provider tells you not to drink. ? You are pregnant, may be pregnant, or are planning to become pregnant.  If you drink alcohol: ? Limit how much you have to 0-1 drink a day. ? Be aware of how much alcohol is in your drink. In the [place] one drink equals one 12 oz bottle of beer (355 mL), one 5 oz glass of wine (148 mL), or one 1 oz glass of hard liquor (44 mL). Lifestyle  Take daily care of your teeth and gums.  Stay active. Exercise for at least 30 minutes on 5 or more days each week.  Do not use any products that contain nicotine or tobacco, such as cigarettes, e-cigarettes, and chewing tobacco. If you need help quitting, ask your health care provider.  If you are sexually active, practice safe sex. Use a condom or other form of birth control (contraception) in order to prevent pregnancy and STIs (sexually transmitted infections).  If told by your health care provider, take low-dose aspirin daily starting at  age 69. What's next?  Visit your health care provider once a year for a well check visit.  Ask your health care provider how often you should have your eyes and teeth checked.  Stay up to date on all vaccines. This information is not intended to replace advice given to you by your health care provider. Make sure you discuss any questions you have with your health care provider. Document Released: 09/20/2015 Document Revised: 05/05/2018 Document Reviewed: 05/05/2018 Elsevier Patient Education  2020 [employer].

## 2019-05-08 ENCOUNTER — Encounter: Payer: Self-pay | Admitting: Family Medicine

## 2019-06-12 ENCOUNTER — Encounter: Payer: Self-pay | Admitting: Family Medicine

## 2019-07-10 ENCOUNTER — Encounter: Payer: Self-pay | Admitting: Family Medicine

## 2019-08-07 ENCOUNTER — Other Ambulatory Visit: Payer: Self-pay

## 2019-08-07 DIAGNOSIS — Z20822 Contact with and (suspected) exposure to covid-19: Secondary | ICD-10-CM

## 2019-08-08 LAB — NOVEL CORONAVIRUS, NAA: SARS-CoV-2, NAA: NOT DETECTED

## 2019-08-17 ENCOUNTER — Other Ambulatory Visit: Payer: Self-pay | Admitting: Family Medicine

## 2019-08-22 ENCOUNTER — Ambulatory Visit (INDEPENDENT_AMBULATORY_CARE_PROVIDER_SITE_OTHER): Payer: Managed Care, Other (non HMO) | Admitting: Family Medicine

## 2019-08-22 ENCOUNTER — Encounter: Payer: Self-pay | Admitting: Family Medicine

## 2019-08-22 VITALS — BP 117/78

## 2019-08-22 DIAGNOSIS — F430 Acute stress reaction: Secondary | ICD-10-CM

## 2019-08-22 DIAGNOSIS — E669 Obesity, unspecified: Secondary | ICD-10-CM

## 2019-08-22 DIAGNOSIS — I10 Essential (primary) hypertension: Secondary | ICD-10-CM | POA: Diagnosis not present

## 2019-08-22 DIAGNOSIS — N951 Menopausal and female climacteric states: Secondary | ICD-10-CM

## 2019-08-22 DIAGNOSIS — F5102 Adjustment insomnia: Secondary | ICD-10-CM | POA: Diagnosis not present

## 2019-08-22 MED ORDER — ESCITALOPRAM OXALATE 10 MG PO TABS: 5.0000 mg | ORAL_TABLET | Freq: Every day | ORAL | 3 refills | Status: DC

## 2019-08-22 NOTE — Progress Notes (Signed)
Virtual Visit via Video Note  Subjective  CC:  Chief Complaint  Patient presents with  . Hypertension  . Anxiety     I connected with Angelica Lester on 08/22/19 at 10:00 AM EST by a video enabled telemedicine application and verified that I am speaking with the correct person using two identifiers. Location patient: Home Location provider: [facility] [place] participating in the virtual visit: Angelica Lester, Leamon Arnt, MD Serita Sheller, [place] discussed the limitations of evaluation and management by telemedicine and the availability of in person appointments. The patient expressed understanding and agreed to proceed. HPI: Angelica Lester is a 48 y.o. female who was contacted today to address the problems listed above in the chief complaint/HTN f/u:  . Hypertension f/u: Control is good . Pt reports she is doing well. taking medications as instructed, no medication side effects noted, no TIAs, no chest pain on exertion, no dyspnea on exertion, no swelling of ankles. Checks outside of the office and always in normal range. No AEs from meds. She denies adverse effects from his BP medications. Compliance with medication is good.  . Obesity: is working on weight loss! Eating better: still eats out a lot but making better choices; also walking and treadmill and handweights for exercise. About 10-15 minutes most days of the week x 2 months. Unfortunately weight has increased. She questions if it is due to the lexapro. See next . Adjustment insomnia/stress/perimenopausal hot flushes: in April we started lexapro. Since, she feels less stressed, less anxious and irritable however feels "sluggish" and has weight gain. It has helped the hot flushes as well. No depressive sxs. No panic sxs.   BP Readings from Last 3 Encounters:  08/22/19 117/78  04/05/19 124/78  02/02/19 111/85   Wt Readings from Last 3 Encounters:  04/05/19 225 lb 3.2 oz (102.2 kg)  02/02/19  218 lb (98.9 kg)  01/05/19 221 lb 6.4 oz (100.4 kg)    Lab Results  Component Value Date   CHOL 209 (H) 04/05/2019   CHOL 194 03/29/2018   CHOL 223 (H) 10/01/2017   Lab Results  Component Value Date   HDL 57.40 04/05/2019   HDL 56.50 03/29/2018   HDL 57.50 10/01/2017   Lab Results  Component Value Date   LDLCALC 112 (H) 04/05/2019   LDLCALC 111 (H) 03/29/2018   Lab Results  Component Value Date   TRIG 198.0 (H) 04/05/2019   TRIG 131.0 03/29/2018   TRIG 222.0 (H) 10/01/2017   Lab Results  Component Value Date   CHOLHDL 4 04/05/2019   CHOLHDL 3 03/29/2018   CHOLHDL 4 10/01/2017   Lab Results  Component Value Date   LDLDIRECT 137.0 10/01/2017   Lab Results  Component Value Date   CREATININE 0.65 04/05/2019   BUN 8 04/05/2019   NA 132 (L) 04/05/2019   K 3.9 04/05/2019   CL 94 (L) 04/05/2019   CO2 29 04/05/2019   Lab Results  Component Value Date   TSH 0.93 04/05/2019     The 10-year ASCVD risk score [name], et al., 2013) is: 1.4%   Values used to calculate the score:     Age: 51 years     Sex: Female     Is Non-Hispanic African American: No     Diabetic: No     Tobacco smoker: No     Systolic Blood Pressure: 123XX123 mmHg  Is BP treated: Yes     HDL Cholesterol: 57.4 mg/dL     Total Cholesterol: 209 mg/dL  Assessment  1. Essential hypertension   2. Obesity (BMI 30-39.9)   3. Perimenopausal vasomotor symptoms   4. Adjustment insomnia   5. Stress reaction      Plan   Hypertension f/u:  This medical condition is well controlled. There are no signs of complications, medication side effects, or red flags. Patient is instructed to continue the current treatment plan without change in therapies or medications.   Obesity: education on logging food diary to better assess her caloric intake. Discussed ways to improve exercise.   Will decrease lexapro to 5mg  and reassess in 4 weeks; hopefully will have more energy but still have her stress/anxiety  reaction controlled and continue to sleep well w/ less hot flushes. IF weight continues to rise or SEs from lexapro persist, will wean off. Patient understands and agrees with care plan.    I discussed the assessment and treatment plan with the patient. The patient was provided an opportunity to ask questions and all were answered. The patient agreed with the plan and demonstrated an understanding of the instructions.   The patient was advised to call back or seek an in-person evaluation if the symptoms worsen or if the condition fails to improve as anticipated. Follow up: July for cpe and HTN f/u.  Visit date not found  Meds ordered this encounter  Medications  . escitalopram (LEXAPRO) 10 MG tablet    Sig: Take 0.5 tablets (5 mg total) by mouth daily.    Dispense:  90 tablet    Refill:  3      I reviewed the patients updated PMH, FH, and SocHx.    Patient Active Problem List   Diagnosis Date Noted  . Essential hypertension 03/12/2017    Priority: High  . 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    Priority: High  . Obesity (BMI 30-39.9) 03/09/2016    Priority: High  . Family history of colon cancer 09/27/2013    Priority: High  . GERD (gastroesophageal reflux disease) 09/27/2013    Priority: Medium  . Irritable bowel syndrome 08/28/2010    Priority: Medium  . Migraine headache 02/24/2010    Priority: Medium  . Perimenopausal vasomotor symptoms 03/12/2017    Priority: Low  . Seasonal allergies 01/15/2014    Priority: Low  . 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 Adjustment insomnia 01/05/2019   Current Meds  Medication Sig  . escitalopram (LEXAPRO) 10 MG tablet Take 0.5 tablets (5 mg total) by mouth daily.  . fluticasone (FLONASE) 50 MCG/ACT nasal spray USE 1 SPRAY INTO BOTH NOSTRILS DAILY  . ibuprofen (ADVIL,MOTRIN) 200 MG tablet Take 400 mg by mouth as needed for headache, mild pain or moderate pain.   Marland Kitchen lisinopril-hydrochlorothiazide (ZESTORETIC) 20-25 MG  tablet Take 1 tablet by mouth daily.  . montelukast (SINGULAIR) 10 MG tablet Take 1 tablet (10 mg total) by mouth daily.  . SUMAtriptan (IMITREX) 100 MG tablet Take 100 mg by mouth every 2 (two) hours as needed for migraine. May repeat in 2 hours if headache persists or recurs.   Marland Kitchen SYNTHROID 150 MCG tablet Take 150 mcg by mouth daily.  . [DISCONTINUED] escitalopram (LEXAPRO) 10 MG tablet Take 1 tablet (10 mg total) by mouth daily.    Allergies: Patient is allergic to fexofenadine and amoxicillin. Family History: Patient family history includes Alzheimer's disease in her paternal grandmother; Atrial fibrillation in her  father and mother; COPD in her mother; Colon cancer in her paternal grandfather; Heart disease in her sister; Hyperlipidemia in her mother; Hypertension in her mother; Liver disease in her sister. Social History:  Patient  reports that she has never smoked. She has never used smokeless tobacco. She reports current alcohol use of about 3.0 standard drinks of alcohol per week. She reports that she does not use drugs.  Review of Systems: Constitutional: Negative for fever malaise or anorexia Cardiovascular: negative for chest pain Respiratory: negative for SOB or persistent cough Gastrointestinal: negative for abdominal pain  OBJECTIVE Vitals: BP 117/78  General: no acute distress , A&Ox3  Leamon Arnt, MD

## 2019-08-22 NOTE — Patient Instructions (Signed)
Please return in 6 months for your annual complete physical; please come fasting.   If you have any questions or concerns, please don't hesitate to send me a message via MyChart or call the office at [phone]. Thank you for visiting with us today! It's our pleasure caring for you.  

## 2019-09-29 ENCOUNTER — Encounter: Payer: Self-pay | Admitting: Family Medicine

## 2019-09-30 NOTE — Telephone Encounter (Signed)
Please see message. I have updated Shingrix vaccine.

## 2019-10-19 ENCOUNTER — Telehealth: Payer: Self-pay | Admitting: Family Medicine

## 2019-10-19 NOTE — Telephone Encounter (Signed)
Nurse Assessment Nurse: Thad Ranger RN, Denise Date/Time (Eastern Time): 10/19/2019 10:11:49 AM Confirm and document reason for call. If symptomatic, describe symptoms. ---Caller states having abd pain on right side since Mon. She says it comes and goes. Pain level 4-5 currently, dull ache. She says she hasn't been urinating much, but she drinks a lot of water. Has the patient had close contact with a person known or suspected to have the novel coronavirus illness OR traveled / lives in area with major community spread (including international travel) in the last 14 days from the onset of symptoms? * If Asymptomatic, screen for exposure and travel within the last 14 days. ---No Does the patient have any new or worsening symptoms? ---Yes Will a triage be completed? ---Yes Related visit to physician within the last 2 weeks? ---No Does the PT have any chronic conditions? (i.e. diabetes, asthma, this includes High risk factors for pregnancy, etc.) ---No Is the patient pregnant or possibly pregnant? (Ask all females between the ages of 75-55) ---No Is this a behavioral health or substance abuse call? ---No Guidelines Guideline Title Affirmed Question Affirmed Notes Nurse Date/Time Eilene Ghazi Time) Abdominal Pain - Female [1] MILD-MODERATE pain AND [2] constant Carmon, RN, Langley Gauss 10/19/2019 10:12:39 AM PLEASE NOTE: All timestamps contained within this report are represented as [place] Standard Time. CONFIDENTIALTY NOTICE: This fax transmission is intended only for the addressee. It contains information that is legally privileged, confidential or otherwise protected from use or disclosure. If you are not the intended recipient, you are strictly prohibited from reviewing, disclosing, copying using or disseminating any of this information or taking any action in reliance on or regarding this information. If you have received this fax in error, please notify us immediately by telephone so that we can  arrange for its return to [place]. Phone: [phone], Toll-Free: [phone], Fax: [phone] Page: 2 of 2 Call Id: [id number] Guidelines Guideline Title Affirmed Question Affirmed Notes Nurse Date/Time Eilene Ghazi Time) AND [3] present > 2 hours Disp. Time Eilene Ghazi Time) Disposition Final User 10/19/2019 10:17:10 AM Call Completed Thad Ranger, RN, Langley Gauss 10/19/2019 10:14:14 AM See HCP within 4 Hours (or PCP triage) Yes Carmon, RN, Yevette Edwards Disagree/Comply Comply Caller Understands Yes PreDisposition Call Doctor Care Advice Given Per Guideline SEE HCP WITHIN 4 HOURS (OR PCP TRIAGE): REST: Lie down and rest until seen. NOTHING BY MOUTH: Do not eat or drink anything for now. CALL BACK IF: * You become worse. CARE ADVICE given per Abdominal Pain, Female (Adult) Guideline.    Pt called and spoke with [facility]. [facility] called backline to try and schedule an appt within 4 hours for pt to see provider. Spoke with pt and notified her that there were no appointments available for today and could schedule her with another provider at a different office. Pt refused and asked to see Dr. Jonni Sanger in office tomorrow.

## 2019-10-20 ENCOUNTER — Ambulatory Visit (INDEPENDENT_AMBULATORY_CARE_PROVIDER_SITE_OTHER): Payer: Managed Care, Other (non HMO)

## 2019-10-20 ENCOUNTER — Other Ambulatory Visit: Payer: Self-pay

## 2019-10-20 ENCOUNTER — Encounter: Payer: Self-pay | Admitting: Family Medicine

## 2019-10-20 ENCOUNTER — Ambulatory Visit (INDEPENDENT_AMBULATORY_CARE_PROVIDER_SITE_OTHER): Payer: Managed Care, Other (non HMO) | Admitting: Family Medicine

## 2019-10-20 VITALS — BP 118/84 | HR 71 | Temp 98.0°F | Ht 65.5 in | Wt 237.0 lb

## 2019-10-20 DIAGNOSIS — R1011 Right upper quadrant pain: Secondary | ICD-10-CM

## 2019-10-20 DIAGNOSIS — R195 Other fecal abnormalities: Secondary | ICD-10-CM

## 2019-10-20 DIAGNOSIS — K581 Irritable bowel syndrome with constipation: Secondary | ICD-10-CM

## 2019-10-20 LAB — POCT URINALYSIS DIPSTICK
Bilirubin, UA: NEGATIVE
Blood, UA: NEGATIVE
Glucose, UA: NEGATIVE
Ketones, UA: NEGATIVE
Leukocytes, UA: NEGATIVE
Nitrite, UA: NEGATIVE
Protein, UA: NEGATIVE
Spec Grav, UA: 1.01 (ref 1.010–1.025)
Urobilinogen, UA: 0.2 E.U./dL
pH, UA: 6.5 (ref 5.0–8.0)

## 2019-10-20 LAB — COMPREHENSIVE METABOLIC PANEL
ALT: 36 U/L — ABNORMAL HIGH (ref 0–35)
AST: 28 U/L (ref 0–37)
Albumin: 4.3 g/dL (ref 3.5–5.2)
Alkaline Phosphatase: 76 U/L (ref 39–117)
BUN: 15 mg/dL (ref 6–23)
CO2: 32 mEq/L (ref 19–32)
Calcium: 9.7 mg/dL (ref 8.4–10.5)
Chloride: 95 mEq/L — ABNORMAL LOW (ref 96–112)
Creatinine, Ser: 0.65 mg/dL (ref 0.40–1.20)
GFR: 96.32 mL/min (ref >60.00)
Glucose, Bld: 90 mg/dL (ref 70–99)
Potassium: 3.5 mEq/L (ref 3.5–5.1)
Sodium: 131 mEq/L — ABNORMAL LOW (ref 135–145)
Total Bilirubin: 0.4 mg/dL (ref 0.2–1.2)
Total Protein: 6.8 g/dL (ref 6.0–8.3)

## 2019-10-20 LAB — CBC WITH DIFFERENTIAL/PLATELET
Basophils Absolute: 0 10*3/uL (ref 0.0–0.1)
Basophils Relative: 0.5 % (ref 0.0–3.0)
Eosinophils Absolute: 0.2 10*3/uL (ref 0.0–0.7)
Eosinophils Relative: 2.3 % (ref 0.0–5.0)
HCT: 39.6 % (ref 36.0–46.0)
Hemoglobin: 13.5 g/dL (ref 12.0–15.0)
Lymphocytes Relative: 21.9 % (ref 12.0–46.0)
Lymphs Abs: 1.7 10*3/uL (ref 0.7–4.0)
MCHC: 34.1 g/dL (ref 30.0–36.0)
MCV: 88.1 fl (ref 78.0–100.0)
Monocytes Absolute: 0.5 10*3/uL (ref 0.1–1.0)
Monocytes Relative: 6.7 % (ref 3.0–12.0)
Neutro Abs: 5.3 10*3/uL (ref 1.4–7.7)
Neutrophils Relative %: 68.6 % (ref 43.0–77.0)
Platelets: 300 10*3/uL (ref 150.0–400.0)
RBC: 4.49 Mil/uL (ref 3.87–5.11)
RDW: 13.7 % (ref 11.5–15.5)
WBC: 7.7 10*3/uL (ref 4.0–10.5)

## 2019-10-20 LAB — POCT URINE PREGNANCY: Preg Test, Ur: NEGATIVE

## 2019-10-20 LAB — LIPASE: Lipase: 21 U/L (ref 11.0–59.0)

## 2019-10-20 MED ORDER — DICYCLOMINE HCL 10 MG PO CAPS: 10.0000 mg | ORAL_CAPSULE | Freq: Three times a day (TID) | ORAL | 1 refills | Status: DC

## 2019-10-20 NOTE — Progress Notes (Signed)
Subjective  CC:  Chief Complaint  Patient presents with  . Abdominal Pain    RUQ pain started 10/16/2019. no fever, nausea or vomiting. patient has excess bloating and gas    HPI: Angelica Lester is a 49 y.o. female who presents to the office today to address the problems listed above in the chief complaint.  49 yo w/ h/o ibs-c c/o 4-5 day RUQ pain that radiates to RLQ. C/o soreness and bloating w/ increased flatus and looser than normal stools w/o diarrhea, blood or mucous in the stool. No h/o diverticular disease. No urinary sxs. LMP was 6 months ago: perimenopausal and sexaully active w/o protection. No pelvic pain or discharge. No f/c/s. No n/v. Appetite is fair. Has been eating more bland w/o improvement in sxs. S/p cholecystectomy in 2005 due to similar symptoms - gallbladder was nl. Has appendix. H/o GERD but not currently active. No increase in pain with deep breathing but hurts with cough. No recent abx or travel.    Assessment  1. RUQ abdominal pain   2. Change in stool   3. Irritable bowel syndrome with constipation      Plan   Abdominal pain:  Unclear etiology; possible IBS flare but differential dx also includes hepatitis, GERD/PUD, pancreatitis, colitis, diverticulitis, renal colick etc... will start workup with xray, labs, stool studies and urine. Further eval based on results. Abdominal exam is now benign. Trial of tylenol, pepto and gas x. Red flag sxs discussed in detail.   Follow up: pending results  Visit date not found  Orders Placed This Encounter  Procedures  . Stool culture  . DG Abd 2 Views  . CBC with Differential/Platelet  . Comprehensive metabolic panel  . Lipase  . C. Difficile Toxin  . POCT urinalysis dipstick  . POCT urine pregnancy   No orders of the defined types were placed in this encounter.     I reviewed the patients updated PMH, FH, and SocHx.    Patient Active Problem List   Diagnosis Date Noted  . Essential hypertension 03/12/2017     Priority: High  . 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    Priority: High  . Obesity (BMI 30-39.9) 03/09/2016    Priority: High  . Family history of colon cancer 09/27/2013    Priority: High  . GERD (gastroesophageal reflux disease) 09/27/2013    Priority: Medium  . Irritable bowel syndrome 08/28/2010    Priority: Medium  . Migraine headache 02/24/2010    Priority: Medium  . Perimenopausal vasomotor symptoms 03/12/2017    Priority: Low  . Seasonal allergies 01/15/2014    Priority: Low  . 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 Adjustment insomnia 01/05/2019   Current Meds  Medication Sig  . escitalopram (LEXAPRO) 10 MG tablet Take 0.5 tablets (5 mg total) by mouth daily.  . fluticasone (FLONASE) 50 MCG/ACT nasal spray USE 1 SPRAY INTO BOTH NOSTRILS DAILY  . ibuprofen (ADVIL,MOTRIN) 200 MG tablet Take 400 mg by mouth as needed for headache, mild pain or moderate pain.   Marland Kitchen lisinopril-hydrochlorothiazide (ZESTORETIC) 20-25 MG tablet Take 1 tablet by mouth daily.  . montelukast (SINGULAIR) 10 MG tablet Take 1 tablet (10 mg total) by mouth daily.  . SUMAtriptan (IMITREX) 100 MG tablet Take 100 mg by mouth every 2 (two) hours as needed for migraine. May repeat in 2 hours if headache persists or recurs.   Marland Kitchen SYNTHROID 150 MCG tablet Take 150 mcg by mouth daily.    Allergies:  Patient is allergic to fexofenadine and amoxicillin. Family History: Patient family history includes Alzheimer's disease in her paternal grandmother; Atrial fibrillation in her father and mother; COPD in her mother; Colon cancer in her paternal grandfather; Heart disease in her sister; Hyperlipidemia in her mother; Hypertension in her mother; Liver disease in her sister. Social History:  Patient  reports that she has never smoked. She has never used smokeless tobacco. She reports current alcohol use of about 3.0 standard drinks of alcohol per week. She reports that she does not use drugs.  Review of  Systems: Constitutional: Negative for fever malaise or anorexia Cardiovascular: negative for chest pain Respiratory: negative for SOB or persistent cough Gastrointestinal: +for abdominal pain  Objective  Vitals: BP 118/84 (BP Location: Left Arm, Patient Position: Sitting, Cuff Size: Large)   Pulse 71   Temp 98 F (36.7 C) (Temporal)   Ht 5' 5.5" (1.664 m)   Wt 237 lb (107.5 kg)   SpO2 94%   BMI 38.84 kg/m  General: no acute distress , A&Ox3, nontoxic appearing HEENT: PEERL, conjunctiva normal,neck is supple Cardiovascular:  RRR without murmur or gallop.  Respiratory:  Good breath sounds bilaterally, CTAB with normal respiratory effort Gastrointestinal: soft, flat abdomen, hypoactive bowel sounds, mild RUQ ttp w/o rebound or guarding. No LQ ttp; no palpable masses, no hepatosplenomegaly, no appreciated hernias, no CVAT, no suprapubic ttp, minimal midepigastric ttp w/o rebound or guarding. + abdominal distention Skin:  Warm, no rashes  Office Visit on 10/20/2019  Component Date Value Ref Range Status  . Color, UA 10/20/2019 light yellow   Final  . Clarity, UA 10/20/2019 clear   Final  . Glucose, UA 10/20/2019 Negative  Negative Final  . Bilirubin, UA 10/20/2019 Negative   Final  . Ketones, UA 10/20/2019 Negative   Final  . Spec Grav, UA 10/20/2019 1.010  1.010 - 1.025 Final  . Blood, UA 10/20/2019 Negative   Final  . pH, UA 10/20/2019 6.5  5.0 - 8.0 Final  . Protein, UA 10/20/2019 Negative  Negative Final  . Urobilinogen, UA 10/20/2019 0.2  0.2 or 1.0 E.U./dL Final  . Nitrite, UA 10/20/2019 Negative   Final  . Leukocytes, UA 10/20/2019 Negative  Negative Final  . Preg Test, Ur 10/20/2019 Negative  Negative Final      Commons side effects, risks, benefits, and alternatives for medications and treatment plan prescribed today were discussed, and the patient expressed understanding of the given instructions. Patient is instructed to call or message via MyChart if he/she has any  questions or concerns regarding our treatment plan. No barriers to understanding were identified. We discussed Red Flag symptoms and signs in detail. Patient expressed understanding regarding what to do in case of urgent or emergency type symptoms.   Medication list was reconciled, printed and provided to the patient in AVS. Patient instructions and summary information was reviewed with the patient as documented in the AVS. This note was prepared with assistance of Dragon voice recognition software. Occasional wrong-word or sound-a-like substitutions may have occurred due to the inherent limitations of voice recognition software  This visit occurred during the SARS-CoV-2 public health emergency.  Safety protocols were in place, including screening questions prior to the visit, additional usage of staff PPE, and extensive cleaning of exam room while observing appropriate contact time as indicated for disinfecting solutions.

## 2019-10-20 NOTE — Patient Instructions (Signed)
Please follow up if symptoms do not improve or as needed.   Start gax x,pepto bismal, tyelnol and monitor your symptoms.  If fever or worsening pain, go to ER for further evaluation.  I will let you know what the blood work shows and see if that leads [place] to the cause.    Abdominal Pain, Adult Pain in the abdomen (abdominal pain) can be caused by many things. Often, abdominal pain is not serious and it gets better with no treatment or by being treated at home. However, sometimes abdominal pain is serious. Your health care provider will ask questions about your medical history and do a physical exam to try to determine the cause of your abdominal pain. Follow these instructions at home:  Medicines  Take over-the-counter and prescription medicines only as told by your health care provider.  Do not take a laxative unless told by your health care provider. General instructions  Watch your condition for any changes.  Drink enough fluid to keep your urine pale yellow.  Keep all follow-up visits as told by your health care provider. This is important. Contact a health care provider if:  Your abdominal pain changes or gets worse.  You are not hungry or you lose weight without trying.  You are constipated or have diarrhea for more than 2-3 days.  You have pain when you urinate or have a bowel movement.  Your abdominal pain wakes you up at night.  Your pain gets worse with meals, after eating, or with certain foods.  You are vomiting and cannot keep anything down.  You have a fever.  You have blood in your urine. Get help right away if:  Your pain does not go away as soon as your health care provider told you to expect.  You cannot stop vomiting.  Your pain is only in areas of the abdomen, such as the right side or the left lower portion of the abdomen. Pain on the right side could be caused by appendicitis.  You have bloody or black stools, or stools that look like  tar.  You have severe pain, cramping, or bloating in your abdomen.  You have signs of dehydration, such as: ? Dark urine, very little urine, or no urine. ? Cracked lips. ? Dry mouth. ? Sunken eyes. ? Sleepiness. ? Weakness.  You have trouble breathing or chest pain. Summary  Often, abdominal pain is not serious and it gets better with no treatment or by being treated at home. However, sometimes abdominal pain is serious.  Watch your condition for any changes.  Take over-the-counter and prescription medicines only as told by your health care provider.  Contact a health care provider if your abdominal pain changes or gets worse.  Get help right away if you have severe pain, cramping, or bloating in your abdomen. This information is not intended to replace advice given to you by your health care provider. Make sure you discuss any questions you have with your health care provider. Document Revised: 01/02/2019 Document Reviewed: 01/02/2019 Elsevier Patient Education  [place].

## 2019-10-20 NOTE — Addendum Note (Signed)
Addended by: Billey Chang on: 10/20/2019 04:40 PM   Modules accepted: Orders

## 2019-10-20 NOTE — Telephone Encounter (Signed)
Patient was seen 10/20/2019

## 2019-10-23 ENCOUNTER — Other Ambulatory Visit: Payer: Managed Care, Other (non HMO)

## 2019-10-23 ENCOUNTER — Other Ambulatory Visit: Payer: Self-pay

## 2019-10-23 LAB — C.DIFFICILE TOXIN: C. Difficile Toxin A: DETECTED — AB

## 2019-10-23 MED ORDER — METRONIDAZOLE 500 MG PO TABS: 500.0000 mg | ORAL_TABLET | Freq: Three times a day (TID) | ORAL | 0 refills | Status: DC

## 2019-10-23 NOTE — Addendum Note (Signed)
Addended by: Billey Chang on: 10/23/2019 02:56 PM   Modules accepted: Orders

## 2019-10-24 ENCOUNTER — Telehealth: Payer: Self-pay | Admitting: Family Medicine

## 2019-10-24 NOTE — Telephone Encounter (Signed)
Patient is returning Jasmine's call about lab work and asked for a returned call whenever she is available.

## 2019-10-24 NOTE — Telephone Encounter (Signed)
Spoke with patient about lab results.

## 2019-10-27 LAB — STOOL CULTURE
MICRO NUMBER:: 10152201
MICRO NUMBER:: 10152202
MICRO NUMBER:: 10152203
SHIGA RESULT:: NOT DETECTED
SPECIMEN QUALITY:: ADEQUATE
SPECIMEN QUALITY:: ADEQUATE
SPECIMEN QUALITY:: ADEQUATE

## 2019-10-31 ENCOUNTER — Encounter: Payer: Self-pay | Admitting: Family Medicine

## 2019-11-01 ENCOUNTER — Encounter: Payer: Self-pay | Admitting: Family Medicine

## 2019-11-01 ENCOUNTER — Other Ambulatory Visit: Payer: Self-pay

## 2019-11-01 MED ORDER — VANCOMYCIN HCL 125 MG PO CAPS: 125.0000 mg | ORAL_CAPSULE | Freq: Four times a day (QID) | ORAL | 0 refills | Status: DC

## 2019-11-01 NOTE — Telephone Encounter (Signed)
Please order abdominal pelvic CT w/ and w/o contrast for persistent abdominal pain and c.diff infection. Notify pt. thanks

## 2019-11-01 NOTE — Telephone Encounter (Signed)
Patient is wanting to follow up on this, do we need to schedule another appointment?

## 2019-11-01 NOTE — Telephone Encounter (Signed)
Please hold off on abd/pelvic CT scan for now. (see last mychart message)

## 2020-01-29 ENCOUNTER — Other Ambulatory Visit: Payer: Self-pay | Admitting: Family Medicine

## 2020-01-29 DIAGNOSIS — I10 Essential (primary) hypertension: Secondary | ICD-10-CM

## 2020-01-31 LAB — HM MAMMOGRAPHY: HM Mammogram: NORMAL (ref 0–4)

## 2020-02-01 ENCOUNTER — Encounter: Payer: Self-pay | Admitting: Family Medicine

## 2020-02-20 ENCOUNTER — Telehealth (INDEPENDENT_AMBULATORY_CARE_PROVIDER_SITE_OTHER): Payer: Managed Care, Other (non HMO) | Admitting: Physician Assistant

## 2020-02-20 ENCOUNTER — Encounter: Payer: Self-pay | Admitting: Physician Assistant

## 2020-02-20 ENCOUNTER — Other Ambulatory Visit: Payer: Self-pay

## 2020-02-20 VITALS — Ht 65.5 in | Wt 237.0 lb

## 2020-02-20 DIAGNOSIS — R0981 Nasal congestion: Secondary | ICD-10-CM | POA: Diagnosis not present

## 2020-02-20 MED ORDER — DOXYCYCLINE HYCLATE 100 MG PO TABS: 100.0000 mg | ORAL_TABLET | Freq: Two times a day (BID) | ORAL | 0 refills | Status: DC

## 2020-02-20 NOTE — Progress Notes (Addendum)
Virtual Visit via Video   I connected with Angelica Lester on 02/20/20 at 11:30 AM EDT by a video enabled telemedicine application and verified that I am speaking with the correct person using two identifiers. Location patient: Home Location provider: [facility], Office Persons participating in the virtual visit: Angelica Lester, [place] PA-C   I discussed the limitations of evaluation and management by telemedicine and the availability of in person appointments. The patient expressed understanding and agreed to proceed.  Bartholomew Boards CMA and Anselmo Pickler LPN ,acting as a Education administrator for Inda Coke, PA.,have documented all relevant documentation on the behalf of Inda Coke, PA,as directed by  Inda Coke, PA while in the presence of Inda Coke, [place].   Subjective:   HPI:   Sinus Problem This is a new problem. The current episode started in the past 7 days. The problem has been gradually worsening since onset. There has been no fever. Her pain is at a severity of 2/10. The pain is mild. Associated symptoms include congestion, coughing, ear pain, headaches, sinus pressure and a sore throat. Pertinent negatives include no chills or shortness of breath. (Right > left ) Past treatments include oral decongestants. The treatment provided no relief.   Recently went to the beach and when she got back from the beach her symptoms started.   She is fully COVID-19 vaccinated.  ROS: See pertinent positives and negatives per HPI.  Patient Active Problem List   Diagnosis Date Noted  . Adjustment insomnia 01/05/2019  . Essential hypertension 03/12/2017  . Perimenopausal vasomotor symptoms 03/12/2017  . 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 Obesity (BMI 30-39.9) 03/09/2016  . Seasonal allergies 01/15/2014  . GERD (gastroesophageal reflux disease) 09/27/2013  . Family history of colon cancer 09/27/2013  . 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 Irritable bowel syndrome  08/28/2010  . Migraine headache 02/24/2010    Social History   Tobacco Use  . Smoking status: Never Smoker  . Smokeless tobacco: Never Used  Substance Use Topics  . Alcohol use: Yes    Alcohol/week: 3.0 standard drinks    Types: 3 Glasses of wine per week    Comment: occasionally    Current Outpatient Medications:  .  escitalopram (LEXAPRO) 10 MG tablet, Take 0.5 tablets (5 mg total) by mouth daily., Disp: 90 tablet, Rfl: 3 .  fluticasone (FLONASE) 50 MCG/ACT nasal spray, USE 1 SPRAY INTO BOTH NOSTRILS DAILY, Disp: 16 g, Rfl: 5 .  ibuprofen (ADVIL,MOTRIN) 200 MG tablet, Take 400 mg by mouth as needed for headache, mild pain or moderate pain. , Disp: , Rfl:  .  lisinopril-hydrochlorothiazide (ZESTORETIC) 20-25 MG tablet, TAKE ONE TABLET BY MOUTH DAILY, Disp: 90 tablet, Rfl: 3 .  montelukast (SINGULAIR) 10 MG tablet, Take 1 tablet (10 mg total) by mouth daily., Disp: 90 tablet, Rfl: 3 .  SUMAtriptan (IMITREX) 100 MG tablet, Take 100 mg by mouth every 2 (two) hours as needed for migraine. May repeat in 2 hours if headache persists or recurs. , Disp: , Rfl:  .  SYNTHROID 150 MCG tablet, Take 150 mcg by mouth daily., Disp: , Rfl:  .  Vitamin D, Ergocalciferol, (DRISDOL) 1.25 MG (50000 UNIT) CAPS capsule, Take 50,000 Units by mouth once a week., Disp: , Rfl:  .  dicyclomine (BENTYL) 10 MG capsule, Take 1 capsule (10 mg total) by mouth 3 (three) times daily before meals. As needed (Patient not taking: Reported on 02/20/2020), Disp: 60 capsule, Rfl: 1 .  doxycycline (VIBRA-TABS) 100 MG  tablet, Take 1 tablet (100 mg total) by mouth 2 (two) times daily., Disp: 20 tablet, Rfl: 0  Allergies  Allergen Reactions  . Fexofenadine Other (See Comments)    myalgias myalgias   . Amoxicillin Nausea Only    Objective:   VITALS: Per patient if applicable, see vitals. GENERAL: Alert, appears well and in no acute distress. HEENT: Atraumatic, conjunctiva clear, no obvious abnormalities on inspection of  external nose and ears. NECK: Normal movements of the head and neck. CARDIOPULMONARY: No increased WOB. Speaking in clear sentences. I:E ratio WNL.  MS: Moves all visible extremities without noticeable abnormality. PSYCH: Pleasant and cooperative, well-groomed. Speech normal rate and rhythm. Affect is appropriate. Insight and judgement are appropriate. Attention is focused, linear, and appropriate.  NEURO: CN grossly intact. Oriented as arrived to appointment on time with no prompting. Moves both UE equally.  SKIN: No obvious lesions, wounds, erythema, or cyanosis noted on face or hands.  Assessment and Plan:   Angelica Lester was seen today for sinus problem.  Diagnoses and all orders for this visit:  Sinus congestion  Other orders -     doxycycline (VIBRA-TABS) 100 MG tablet; Take 1 tablet (100 mg total) by mouth 2 (two) times daily.   No red flags on discussion. Will initiate doxycycline per orders. Discussed taking medications as prescribed. Reviewed return precautions including worsening fever, SOB, worsening cough or other concerns. Push fluids and rest. I recommend that patient follow-up if symptoms worsen or persist despite treatment x 7-10 days, sooner if needed.   . Reviewed expectations re: course of current medical issues. . Discussed self-management of symptoms. . Outlined signs and symptoms indicating need for more acute intervention. . Patient verbalized understanding and all questions were answered. Marland Kitchen Health Maintenance issues including appropriate healthy diet, exercise, and smoking avoidance were discussed with patient. . See orders for this visit as documented in the electronic medical record.  I discussed the assessment and treatment plan with the patient. The patient was provided an opportunity to ask questions and all were answered. The patient agreed with the plan and demonstrated an understanding of the instructions.   The patient was advised to call back or seek an  in-person evaluation if the symptoms worsen or if the condition fails to improve as anticipated.   CMA or LPN served as scribe during this visit. History, Physical, and Plan performed by medical provider. The above documentation has been reviewed and is accurate and complete.  [place], [place] 02/20/2020

## 2020-03-20 ENCOUNTER — Encounter: Payer: Self-pay | Admitting: Family Medicine

## 2020-03-20 ENCOUNTER — Other Ambulatory Visit: Payer: Self-pay

## 2020-03-20 ENCOUNTER — Ambulatory Visit (INDEPENDENT_AMBULATORY_CARE_PROVIDER_SITE_OTHER): Payer: Managed Care, Other (non HMO) | Admitting: Family Medicine

## 2020-03-20 VITALS — BP 120/84 | HR 83 | Temp 98.3°F | Ht 65.5 in | Wt 237.0 lb

## 2020-03-20 DIAGNOSIS — N951 Menopausal and female climacteric states: Secondary | ICD-10-CM

## 2020-03-20 DIAGNOSIS — I10 Essential (primary) hypertension: Secondary | ICD-10-CM | POA: Diagnosis not present

## 2020-03-20 DIAGNOSIS — F5102 Adjustment insomnia: Secondary | ICD-10-CM

## 2020-03-20 DIAGNOSIS — F430 Acute stress reaction: Secondary | ICD-10-CM

## 2020-03-20 MED ORDER — ESTRADIOL 0.0375 MG/24HR TD PTTW: 1.0000 | MEDICATED_PATCH | TRANSDERMAL | 11 refills | Status: DC

## 2020-03-20 MED ORDER — MONTELUKAST SODIUM 10 MG PO TABS: 10.0000 mg | ORAL_TABLET | Freq: Every day | ORAL | 3 refills | Status: DC

## 2020-03-20 NOTE — Patient Instructions (Signed)
It's great seeing you!  Wean off the lexapro: Take 5mg  every other day for a week, Then 5mg  on T and Sunday. Then stop.   I've ordered a low dose hormone patch for you. It should help! If it is helping but still not enough, send me a message and I can adjust the dose.    Menopause and Hormone Replacement Therapy Menopause is a normal time of life when menstrual periods stop completely and the ovaries stop producing the female hormones estrogen and progesterone. This lack of hormones can affect your health and cause undesirable symptoms. Hormone replacement therapy (HRT) can relieve some of those symptoms. What is hormone replacement therapy? HRT is the use of artificial (synthetic) hormones to replace hormones that your body has stopped producing because you have reached menopause. What are my options for HRT?  HRT may consist of the synthetic hormones estrogen and progestin, or it may consist of only estrogen (estrogen-only therapy). You and your health care provider will decide which form of HRT is best for you. If you choose to be on HRT and you have a uterus, estrogen and progestin are usually prescribed. Estrogen-only therapy is used for women who do not have a uterus. Possible options for taking HRT include:  Pills.  Patches.  Gels.  Sprays.  Vaginal cream.  Vaginal rings.  Vaginal inserts. The amount of hormone(s) that you take and how long you take the hormone(s) varies according to your health. It is important to:  Begin HRT with the lowest possible dosage.  Stop HRT as soon as your health care provider tells you to stop.  Work with your health care provider so that you feel informed and comfortable with your decisions. What are the benefits of HRT? HRT can reduce the frequency and severity of menopausal symptoms. Benefits of HRT vary according to the kind of symptoms that you have, how severe they are, and your overall health. HRT may help to improve the following  symptoms of menopause:  Hot flashes and night sweats. These are sudden feelings of heat that spread over the face and body. The skin may turn red, like a blush. Night sweats are hot flashes that happen while you are sleeping or trying to sleep.  Bone loss (osteoporosis). The body loses calcium more quickly after menopause, causing the bones to become weaker. This can increase the risk for bone breaks (fractures).  Vaginal dryness. The lining of the vagina can become thin and dry, which can cause pain during sex or cause infection, burning, or itching.  Urinary tract infections.  Urinary incontinence. This is the inability to control when you pass urine.  Irritability.  Short-term memory problems. What are the risks of HRT? Risks of HRT vary depending on your individual health and medical history. Risks of HRT also depend on whether you receive both estrogen and progestin or you receive estrogen only. HRT may increase the risk of:  Spotting. This is when a small amount of blood leaks from the vagina unexpectedly.  Endometrial cancer. This cancer is in the lining of the uterus (endometrium).  Breast cancer.  Increased density of breast tissue. This can make it harder to find breast cancer on a breast X-ray (mammogram).  Stroke.  Heart disease.  Blood clots.  Gallbladder disease.  Liver disease. Risks of HRT can increase if you have any of the following conditions:  Endometrial cancer.  Liver disease.  Heart disease.  Breast cancer.  History of blood clots.  History of stroke.  Follow these instructions at home:  Take over-the-counter and prescription medicines only as told by your health care provider.  Get mammograms, pelvic exams, and medical checkups as often as told by your health care provider.  Have Pap tests done as often as told by your health care provider. A Pap test is sometimes called a Pap smear. It is a screening test that is used to check for signs of  cancer of the cervix and vagina. A Pap test can also identify the presence of infection or precancerous changes. Pap tests may be done: ? Every 3 years, starting at age 17. ? Every 5 years, starting after age 41, in combination with testing for human papillomavirus (HPV). ? More often or less often depending on other medical conditions you have, your age, and other risk factors.  It is up to you to get the results of your Pap test. Ask your health care provider, or the department that is doing the test, when your results will be ready.  Keep all follow-up visits as told by your health care provider. This is important. Contact a health care provider if you have:  Pain or swelling in your legs.  Shortness of breath.  Chest pain.  Lumps or changes in your breasts or armpits.  Slurred speech.  Pain, burning, or bleeding when you urinate.  Unusual vaginal bleeding.  Dizziness or headaches.  Weakness or numbness in any part of your arms or legs.  Pain in your abdomen. Summary  Menopause is a normal time of life when menstrual periods stop completely and the ovaries stop producing the female hormones estrogen and progesterone.  Hormone replacement therapy (HRT) can relieve some of the symptoms of menopause.  HRT can reduce the frequency and severity of menopausal symptoms.  Risks of HRT vary depending on your individual health and medical history. This information is not intended to replace advice given to you by your health care provider. Make sure you discuss any questions you have with your health care provider. Document Revised: 04/26/2018 Document Reviewed: 04/26/2018 Elsevier Patient Education  2020 [employer].

## 2020-03-20 NOTE — Progress Notes (Signed)
Subjective  CC:  Chief Complaint  Patient presents with  . Medication Problem    requesting going off Lexapro and low dose estrogen     HPI: Angelica Lester is a 49 y.o. female who presents to the office today to address the problems listed above in the chief complaint.  Hypertension f/u: Control is good . Pt reports she is doing well. taking medications as instructed, no medication side effects noted, no TIAs, no chest pain on exertion, no dyspnea on exertion, no swelling of ankles. She denies adverse effects from his BP medications. Compliance with medication is good.   Stress reaction: treated with lexapro for over a year now. Has had weight gain and difficulty losing which she contributes to meds. Was also started to help with hot flushes: but these are now worsening disrupting her sleep and tolerance for the summertime heat. Mood is great and has been stable even with decrease in dose to 5. She feels she is ready to stop them.  CVR: htn and obesity. No dm, HLD or prem CAD. No female cancers. Screens are up to date.   Assessment  1. Essential hypertension   2. Perimenopausal vasomotor symptoms   3. Adjustment insomnia   4. Stress reaction      Plan    Hypertension f/u: BP control is well controlled. Continue same meds. Check labs at next cpe  Stress reaction: agree; time to wean meds. Mood is stable and has had mild adverse effects: wean x 2 weeks. Monitor mood  HRT for vasomotor sxs: discussed risks and benefits. Mildly elevated CVR noted. Start for QOL. vivelle-dot low dose ordered. Monitor bp, cholesterol and efficacy. May need to increase dose.  Education regarding management of these chronic disease states was given. Management strategies discussed on successive visits include dietary and exercise recommendations, goals of achieving and maintaining IBW, and lifestyle modifications aiming for adequate sleep and minimizing stressors.   Follow up: No follow-ups on  file.  No orders of the defined types were placed in this encounter.  No orders of the defined types were placed in this encounter.     BP Readings from Last 3 Encounters:  03/20/20 120/84  10/20/19 118/84  08/22/19 117/78   Wt Readings from Last 3 Encounters:  03/20/20 237 lb (107.5 kg)  02/20/20 237 lb (107.5 kg)  10/20/19 237 lb (107.5 kg)    Lab Results  Component Value Date   CHOL 209 (H) 04/05/2019   CHOL 194 03/29/2018   CHOL 223 (H) 10/01/2017   Lab Results  Component Value Date   HDL 57.40 04/05/2019   HDL 56.50 03/29/2018   HDL 57.50 10/01/2017   Lab Results  Component Value Date   LDLCALC 112 (H) 04/05/2019   LDLCALC 111 (H) 03/29/2018   Lab Results  Component Value Date   TRIG 198.0 (H) 04/05/2019   TRIG 131.0 03/29/2018   TRIG 222.0 (H) 10/01/2017   Lab Results  Component Value Date   CHOLHDL 4 04/05/2019   CHOLHDL 3 03/29/2018   CHOLHDL 4 10/01/2017   Lab Results  Component Value Date   LDLDIRECT 137.0 10/01/2017   Lab Results  Component Value Date   CREATININE 0.65 10/20/2019   BUN 15 10/20/2019   NA 131 (L) 10/20/2019   K 3.5 10/20/2019   CL 95 (L) 10/20/2019   CO2 32 10/20/2019    The 10-year ASCVD risk score [name], et al., 2013) is: 1.5%   Values used to calculate  the score:     Age: 59 years     Sex: Female     Is Non-Hispanic African American: No     Diabetic: No     Tobacco smoker: No     Systolic Blood Pressure: 342 mmHg     Is BP treated: Yes     HDL Cholesterol: 57.4 mg/dL     Total Cholesterol: 209 mg/dL  I reviewed the patients updated PMH, FH, and SocHx.    Patient Active Problem List   Diagnosis Date Noted  . Essential hypertension 03/12/2017    Priority: High  . 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    Priority: High  . Obesity (BMI 30-39.9) 03/09/2016    Priority: High  . Family history of colon cancer 09/27/2013    Priority: High  . GERD (gastroesophageal reflux disease) 09/27/2013     Priority: Medium  . Irritable bowel syndrome 08/28/2010    Priority: Medium  . Migraine headache 02/24/2010    Priority: Medium  . Perimenopausal vasomotor symptoms 03/12/2017    Priority: Low  . Seasonal allergies 01/15/2014    Priority: Low  . 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 Adjustment insomnia 01/05/2019    Allergies: Fexofenadine and Amoxicillin  Social History: Patient  reports that she has never smoked. She has never used smokeless tobacco. She reports current alcohol use of about 3.0 standard drinks of alcohol per week. She reports that she does not use drugs.  Current Meds  Medication Sig  . dicyclomine (BENTYL) 10 MG capsule Take 1 capsule (10 mg total) by mouth 3 (three) times daily before meals. As needed  . escitalopram (LEXAPRO) 10 MG tablet Take 0.5 tablets (5 mg total) by mouth daily.  . fluticasone (FLONASE) 50 MCG/ACT nasal spray USE 1 SPRAY INTO BOTH NOSTRILS DAILY  . ibuprofen (ADVIL,MOTRIN) 200 MG tablet Take 400 mg by mouth as needed for headache, mild pain or moderate pain.   Marland Kitchen lisinopril-hydrochlorothiazide (ZESTORETIC) 20-25 MG tablet TAKE ONE TABLET BY MOUTH DAILY  . montelukast (SINGULAIR) 10 MG tablet Take 1 tablet (10 mg total) by mouth daily.  . SUMAtriptan (IMITREX) 100 MG tablet Take 100 mg by mouth every 2 (two) hours as needed for migraine. May repeat in 2 hours if headache persists or recurs.   Marland Kitchen SYNTHROID 150 MCG tablet Take 150 mcg by mouth daily.  . Vitamin D, Ergocalciferol, (DRISDOL) 1.25 MG (50000 UNIT) CAPS capsule Take 50,000 Units by mouth once a week.    Review of Systems: Cardiovascular: negative for chest pain, palpitations, leg swelling, orthopnea Respiratory: negative for SOB, wheezing or persistent cough Gastrointestinal: negative for abdominal pain Genitourinary: negative for dysuria or gross hematuria  Objective  Vitals: BP 120/84   Pulse 83   Temp 98.3 F (36.8 C) (Temporal)   Ht 5' 5.5" (1.664 m)   Wt 237  lb (107.5 kg)   LMP 06/04/2019   SpO2 98%   BMI 38.84 kg/m  General: no acute distress  Psych:  Alert and oriented, normal mood and affect HEENT:  Normocephalic, atraumatic, supple neck  Cardiovascular:  RRR without murmur. no edema Respiratory:  Good breath sounds bilaterally, CTAB with normal respiratory effort   Commons side effects, risks, benefits, and alternatives for medications and treatment plan prescribed today were discussed, and the patient expressed understanding of the given instructions. Patient is instructed to call or message via MyChart if he/she has any questions or concerns regarding our treatment plan. No barriers to understanding were identified.  We discussed Red Flag symptoms and signs in detail. Patient expressed understanding regarding what to do in case of urgent or emergency type symptoms.   Medication list was reconciled, printed and provided to the patient in AVS. Patient instructions and summary information was reviewed with the patient as documented in the AVS. This note was prepared with assistance of Dragon voice recognition software. Occasional wrong-word or sound-a-like substitutions may have occurred due to the inherent limitations of voice recognition software  This visit occurred during the SARS-CoV-2 public health emergency.  Safety protocols were in place, including screening questions prior to the visit, additional usage of staff PPE, and extensive cleaning of exam room while observing appropriate contact time as indicated for disinfecting solutions.

## 2020-04-23 ENCOUNTER — Telehealth (INDEPENDENT_AMBULATORY_CARE_PROVIDER_SITE_OTHER): Payer: Managed Care, Other (non HMO) | Admitting: Family Medicine

## 2020-04-23 ENCOUNTER — Other Ambulatory Visit: Payer: Self-pay

## 2020-04-23 DIAGNOSIS — R519 Headache, unspecified: Secondary | ICD-10-CM | POA: Diagnosis not present

## 2020-04-23 DIAGNOSIS — R0981 Nasal congestion: Secondary | ICD-10-CM | POA: Diagnosis not present

## 2020-04-23 MED ORDER — AZITHROMYCIN 250 MG PO TABS
ORAL_TABLET | ORAL | 0 refills | Status: DC
Start: 2020-04-23 — End: 2020-08-14

## 2020-04-23 MED ORDER — PREDNISONE 20 MG PO TABS
40.0000 mg | ORAL_TABLET | Freq: Every day | ORAL | 0 refills | Status: DC
Start: 2020-04-23 — End: 2020-04-30

## 2020-04-23 NOTE — Patient Instructions (Signed)
-I sent the medication(s) we discussed to your pharmacy: Meds ordered this encounter  Medications  . azithromycin (ZITHROMAX) 250 MG tablet    Sig: 2 tabs day 1, then one tab daily    Dispense:  6 tablet    Refill:  0  . predniSONE (DELTASONE) 20 MG tablet    Sig: Take 2 tablets (40 mg total) by mouth daily with breakfast.    Dispense:  8 tablet    Refill:  0    Please let us know if you have any questions or concerns regarding this prescription.  I hope you are feeling better soon! Seek care promptly if your symptoms worsen, new concerns arise or you are not improving with treatment.   Prednisone tablets What is this medicine? PREDNISONE (PRED ni sone) is a corticosteroid. It is commonly used to treat inflammation of the skin, joints, lungs, and other organs. Common conditions treated include asthma, allergies, and arthritis. It is also used for other conditions, such as blood disorders and diseases of the adrenal glands. This medicine may be used for other purposes; ask your health care provider or pharmacist if you have questions. COMMON BRAND NAME(S): Deltasone, Predone, Sterapred, Sterapred DS What should I tell my health care provider before I take this medicine? They need to know if you have any of these conditions:  Cushing's syndrome  diabetes  glaucoma  heart disease  high blood pressure  infection (especially a virus infection such as chickenpox, cold sores, or herpes)  kidney disease  liver disease  mental illness  myasthenia gravis  osteoporosis  seizures  stomach or intestine problems  thyroid disease  an unusual or allergic reaction to lactose, prednisone, other medicines, foods, dyes, or preservatives  pregnant or trying to get pregnant  breast-feeding How should I use this medicine? Take this medicine by mouth with a glass of water. Follow the directions on the prescription label. Take this medicine with food. If you are taking this  medicine once a day, take it in the morning. Do not take more medicine than you are told to take. Do not suddenly stop taking your medicine because you may develop a severe reaction. Your doctor will tell you how much medicine to take. If your doctor wants you to stop the medicine, the dose may be slowly lowered over time to avoid any side effects. Talk to your pediatrician regarding the use of this medicine in children. Special care may be needed. Overdosage: If you think you have taken too much of this medicine contact a poison control center or emergency room at once. NOTE: This medicine is only for you. Do not share this medicine with others. What if I miss a dose? If you miss a dose, take it as soon as you can. If it is almost time for your next dose, talk to your doctor or health care professional. You may need to miss a dose or take an extra dose. Do not take double or extra doses without advice. What may interact with this medicine? Do not take this medicine with any of the following medications:  metyrapone  mifepristone This medicine may also interact with the following medications:  aminoglutethimide  amphotericin B  aspirin and aspirin-like medicines  barbiturates  certain medicines for diabetes, like glipizide or glyburide  cholestyramine  cholinesterase inhibitors  cyclosporine  digoxin  diuretics  ephedrine  female hormones, like estrogens and birth control pills  isoniazid  ketoconazole  NSAIDS, medicines for pain and inflammation, like ibuprofen  or naproxen  phenytoin  rifampin  toxoids  vaccines  warfarin This list may not describe all possible interactions. Give your health care provider a list of all the medicines, herbs, non-prescription drugs, or dietary supplements you use. Also tell them if you smoke, drink alcohol, or use illegal drugs. Some items may interact with your medicine. What should I watch for while using this medicine? Visit  your doctor or health care professional for regular checks on your progress. If you are taking this medicine over a prolonged period, carry an identification card with your name and address, the type and dose of your medicine, and your doctor's name and address. This medicine may increase your risk of getting an infection. Tell your doctor or health care professional if you are around anyone with measles or chickenpox, or if you develop sores or blisters that do not heal properly. If you are going to have surgery, tell your doctor or health care professional that you have taken this medicine within the last twelve months. Ask your doctor or health care professional about your diet. You may need to lower the amount of salt you eat. This medicine may increase blood sugar. Ask your healthcare provider if changes in diet or medicines are needed if you have diabetes. What side effects may I notice from receiving this medicine? Side effects that you should report to your doctor or health care professional as soon as possible:  allergic reactions like skin rash, itching or hives, swelling of the face, lips, or tongue  changes in emotions or moods  changes in vision  depressed mood  eye pain  fever or chills, cough, sore throat, pain or difficulty passing urine  signs and symptoms of high blood sugar such as being more thirsty or hungry or having to urinate more than normal. You may also feel very tired or have blurry vision.  swelling of ankles, feet Side effects that usually do not require medical attention (report to your doctor or health care professional if they continue or are bothersome):  confusion, excitement, restlessness  headache  nausea, vomiting  skin problems, acne, thin and shiny skin  trouble sleeping  weight gain This list may not describe all possible side effects. Call your doctor for medical advice about side effects. You may report side effects to FDA at  1-800-FDA-1088. Where should I keep my medicine? Keep out of the reach of children. Store at room temperature between 15 and 30 degrees C (59 and 86 degrees F). Protect from light. Keep container tightly closed. Throw away any unused medicine after the expiration date. NOTE: This sheet is a summary. It may not cover all possible information. If you have questions about this medicine, talk to your doctor, pharmacist, or health care provider.  2020 Elsevier/Gold Standard (2018-05-24 10:54:22)   Azithromycin tablets What is this medicine? AZITHROMYCIN (az ith roe MYE sin) is a macrolide antibiotic. It is used to treat or prevent certain kinds of bacterial infections. It will not work for colds, flu, or other viral infections. This medicine may be used for other purposes; ask your health care provider or pharmacist if you have questions. COMMON BRAND NAME(S): Zithromax, Zithromax Tri-Pak, Zithromax Z-Pak What should I tell my health care provider before I take this medicine? They need to know if you have any of these conditions:  history of blood diseases, like leukemia  history of irregular heartbeat  kidney disease  liver disease  myasthenia gravis  an unusual or allergic reaction to  azithromycin, erythromycin, other macrolide antibiotics, foods, dyes, or preservatives  pregnant or trying to get pregnant  breast-feeding How should I use this medicine? Take this medicine by mouth with a full glass of water. Follow the directions on the prescription label. The tablets can be taken with food or on an empty stomach. If the medicine upsets your stomach, take it with food. Take your medicine at regular intervals. Do not take your medicine more often than directed. Take all of your medicine as directed even if you think your are better. Do not skip doses or stop your medicine early. Talk to your pediatrician regarding the use of this medicine in children. While this drug may be prescribed  for children as young as 6 months for selected conditions, precautions do apply. Overdosage: If you think you have taken too much of this medicine contact a poison control center or emergency room at once. NOTE: This medicine is only for you. Do not share this medicine with others. What if I miss a dose? If you miss a dose, take it as soon as you can. If it is almost time for your next dose, take only that dose. Do not take double or extra doses. What may interact with this medicine? Do not take this medicine with any of the following medications:  cisapride  dronedarone  pimozide  thioridazine This medicine may also interact with the following medications:  antacids that contain aluminum or magnesium  birth control pills  colchicine  cyclosporine  digoxin  ergot alkaloids like dihydroergotamine, ergotamine  nelfinavir  other medicines that prolong the QT interval (an abnormal heart rhythm)  phenytoin  warfarin This list may not describe all possible interactions. Give your health care provider a list of all the medicines, herbs, non-prescription drugs, or dietary supplements you use. Also tell them if you smoke, drink alcohol, or use illegal drugs. Some items may interact with your medicine. What should I watch for while using this medicine? Tell your doctor or healthcare provider if your symptoms do not start to get better or if they get worse. This medicine may cause serious skin reactions. They can happen weeks to months after starting the medicine. Contact your healthcare provider right away if you notice fevers or flu-like symptoms with a rash. The rash may be red or purple and then turn into blisters or peeling of the skin. Or, you might notice a red rash with swelling of the face, lips or lymph nodes in your neck or under your arms. Do not treat diarrhea with over the counter products. Contact your doctor if you have diarrhea that lasts more than 2 days or if it is  severe and watery. This medicine can make you more sensitive to the sun. Keep out of the sun. If you cannot avoid being in the sun, wear protective clothing and use sunscreen. Do not use sun lamps or tanning beds/booths. What side effects may I notice from receiving this medicine? Side effects that you should report to your doctor or health care professional as soon as possible:  allergic reactions like skin rash, itching or hives, swelling of the face, lips, or tongue  bloody or watery diarrhea  breathing problems  chest pain  fast, irregular heartbeat  muscle weakness  rash, fever, and swollen lymph nodes  redness, blistering, peeling, or loosening of the skin, including inside the mouth  signs and symptoms of liver injury like dark yellow or brown urine; general ill feeling or flu-like symptoms; light-colored stools;  loss of appetite; nausea; right upper belly pain; unusually weak or tired; yellowing of the eyes or skin  white patches or sores in the mouth  unusually weak or tired Side effects that usually do not require medical attention (report to your doctor or health care professional if they continue or are bothersome):  diarrhea  nausea  stomach pain  vomiting This list may not describe all possible side effects. Call your doctor for medical advice about side effects. You may report side effects to FDA at 1-800-FDA-1088. Where should I keep my medicine? Keep out of the reach of children. Store at room temperature between 15 and 30 degrees C (59 and 86 degrees F). Throw away any unused medicine after the expiration date. NOTE: This sheet is a summary. It may not cover all possible information. If you have questions about this medicine, talk to your doctor, pharmacist, or health care provider.  2020 Elsevier/Gold Standard (2018-12-01 17:19:20)

## 2020-04-23 NOTE — Progress Notes (Signed)
Virtual Visit via Video Note  I connected with Angelica Lester  on 04/23/20 at  1:20 PM EDT by a video enabled telemedicine application and verified that I am speaking with the correct person using two identifiers.  Location patient: home, [place] Location provider:work or home office Persons participating in the virtual visit: patient, provider  I discussed the limitations of evaluation and management by telemedicine and the availability of in person appointments. The patient expressed understanding and agreed to proceed.   HPI:  Acute visit for -started 3-4 weeks ago -symptoms include nasal congestion more on the L than the R, now starting to develop sinus pressure and pain in the L maxillary sinus pain and in the teeth, some thick sinus congestion - white -denies fevers, SOB, CP, cough,NVD,severe headaches -has had sinus infections in the past and this feels like a sinus infection to her -amoxicillin makes her sick on her stomach - no allergies -in the past she reports she has used a zpack and prednisone - this is the only thing that works for her and is requesting this -has been fully vaccinated for COVID19  ROS: See pertinent positives and negatives per HPI.  Past Medical History:  Diagnosis Date  . Allergy   . Colon polyps    benign  . Complication of anesthesia   . GERD (gastroesophageal reflux disease)    occ  . History of thyroid nodule   . Hyperlipidemia   . Hypertension   . Hypothyroidism   . Migraine    "monthly" (10/19/2016)  . 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 Wilson Singer, endocrine  . PONV (postoperative nausea and vomiting)   . Skin cancer    "right arm" (10/19/2016)    Past Surgical History:  Procedure Laterality Date  . BREAST SURGERY  12/14/2017   Reduction  . DILATION AND CURETTAGE OF UTERUS  X 1  . LAPAROSCOPIC CHOLECYSTECTOMY  2005  . MOHS SURGERY Right    arm  . THYROIDECTOMY N/A 10/19/2016   Procedure: TOTAL THYROIDECTOMY;  Surgeon: Armandina Gemma, MD;  Location: [facility];  Service: General;  Laterality: N/A;  . TOTAL THYROIDECTOMY  10/19/2016    Family History  Problem Relation Age of Onset  . Atrial fibrillation Mother   . COPD Mother   . Hyperlipidemia Mother   . Hypertension Mother   . Atrial fibrillation Father   . Heart disease Sister   . Alzheimer's disease Paternal Grandmother   . Colon cancer Paternal Grandfather   . Liver disease Sister        Liver Transplant    SOCIAL HX: see hpi   Current Outpatient Medications:  .  azithromycin (ZITHROMAX) 250 MG tablet, 2 tabs day 1, then one tab daily, Disp: 6 tablet, Rfl: 0 .  dicyclomine (BENTYL) 10 MG capsule, Take 1 capsule (10 mg total) by mouth 3 (three) times daily before meals. As needed, Disp: 60 capsule, Rfl: 1 .  estradiol (VIVELLE-DOT) 0.0375 MG/24HR, Place 1 patch onto the skin 2 (two) times a week., Disp: 8 patch, Rfl: 11 .  fluticasone (FLONASE) 50 MCG/ACT nasal spray, USE 1 SPRAY INTO BOTH NOSTRILS DAILY, Disp: 16 g, Rfl: 5 .  ibuprofen (ADVIL,MOTRIN) 200 MG tablet, Take 400 mg by mouth as needed for headache, mild pain or moderate pain. , Disp: , Rfl:  .  lisinopril-hydrochlorothiazide (ZESTORETIC) 20-25 MG tablet, TAKE ONE TABLET BY MOUTH DAILY, Disp: 90 tablet, Rfl: 3 .  montelukast (SINGULAIR) 10 MG tablet, Take 1 tablet (10 mg  total) by mouth daily., Disp: 90 tablet, Rfl: 3 .  predniSONE (DELTASONE) 20 MG tablet, Take 2 tablets (40 mg total) by mouth daily with breakfast., Disp: 8 tablet, Rfl: 0 .  SUMAtriptan (IMITREX) 100 MG tablet, Take 100 mg by mouth every 2 (two) hours as needed for migraine. May repeat in 2 hours if headache persists or recurs. , Disp: , Rfl:  .  SYNTHROID 150 MCG tablet, Take 150 mcg by mouth daily., Disp: , Rfl:  .  Vitamin D, Ergocalciferol, (DRISDOL) 1.25 MG (50000 UNIT) CAPS capsule, Take 50,000 Units by mouth once a week., Disp: , Rfl:   EXAM:  VITALS per patient if applicable:  GENERAL: alert, oriented, appears well  and in no acute distress  HEENT: atraumatic, conjunttiva clear, no obvious abnormalities on inspection of external nose and ears, points to L maxillary region as area of discomfort  NECK: normal movements of the head and neck  LUNGS: on inspection no signs of respiratory distress, breathing rate appears normal, no obvious gross SOB, gasping or wheezing  CV: no obvious cyanosis  MS: moves all visible extremities without noticeable abnormality  PSYCH/NEURO: pleasant and cooperative, no obvious depression or anxiety, speech and thought processing grossly intact  ASSESSMENT AND PLAN:  Discussed the following assessment and plan:  Facial pain  Sinus congestion  -we discussed possible serious and likely etiologies, options for evaluation and workup, limitations of telemedicine visit vs in person visit, treatment, treatment risks and precautions. Pt prefers to treat via telemedicine empirically rather then risking or undertaking an in person visit at this moment. Discussed standard of care for what may be sinusitis. However, she prefers to treat with a Zpack and prednisone and is rather adamant that this is what works for here. After a lengthy discussion of alternatives and risks sent rx per her request. Also advised COVID19 testing given area of high risk of spread, resp symptoms and increasing number of breakthrough cases.  Advised to seek prompt follow up telemedicine visit or in person care if worsening, new symptoms arise, or if is not improving with treatment.Did let her know that I only do telemedicine on Tuesdays and Thursdays for [place] and advised follow up with PCP or [facility] if needs follow up or if further questions arise.    I discussed the assessment and treatment plan with the patient. The patient was provided an opportunity to ask questions and all were answered. The patient agreed with the plan and demonstrated an understanding of the instructions.   The patient was advised to call  back or seek an in-person evaluation if the symptoms worsen or if the condition fails to improve as anticipated.   Angelica Kern, DO

## 2020-04-30 ENCOUNTER — Encounter: Payer: Self-pay | Admitting: Family Medicine

## 2020-04-30 ENCOUNTER — Telehealth (INDEPENDENT_AMBULATORY_CARE_PROVIDER_SITE_OTHER): Payer: Managed Care, Other (non HMO) | Admitting: Family Medicine

## 2020-04-30 ENCOUNTER — Other Ambulatory Visit: Payer: Self-pay

## 2020-04-30 DIAGNOSIS — F5102 Adjustment insomnia: Secondary | ICD-10-CM | POA: Diagnosis not present

## 2020-04-30 DIAGNOSIS — Z79890 Hormone replacement therapy (postmenopausal): Secondary | ICD-10-CM

## 2020-04-30 DIAGNOSIS — J329 Chronic sinusitis, unspecified: Secondary | ICD-10-CM | POA: Diagnosis not present

## 2020-04-30 DIAGNOSIS — N951 Menopausal and female climacteric states: Secondary | ICD-10-CM

## 2020-04-30 DIAGNOSIS — R6884 Jaw pain: Secondary | ICD-10-CM

## 2020-04-30 DIAGNOSIS — J302 Other seasonal allergic rhinitis: Secondary | ICD-10-CM

## 2020-04-30 MED ORDER — ALPRAZOLAM 0.5 MG PO TABS
0.5000 mg | ORAL_TABLET | Freq: Every day | ORAL | 0 refills | Status: DC | PRN
Start: 2020-04-30 — End: 2020-11-18

## 2020-04-30 MED ORDER — PREDNISONE 10 MG PO TABS: ORAL_TABLET | ORAL | 0 refills | Status: DC

## 2020-04-30 NOTE — Progress Notes (Signed)
Subjective   CC:  Chief Complaint  Patient presents with  . Sinusitis    e-visit last week, prescribed prednisone and z pac - no improvement    Virtual Visit via Video Note I connected with [name] on 04/30/20 at 11:00 AM EDT by a video enabled telemedicine application and verified that I am speaking with the correct person using two identifiers. Location patient: Home Location provider: [facility] [place] participating in the virtual visit: Sharlyn Bologna, MD Reymundo Poll, [place] discussed the limitations of evaluation and management by telemedicine and the availability of in person appointments. The patient expressed understanding and agreed to proceed.  HPI: Angelica Lester is a 49 y.o. female who presents to the office today to address the problems listed above in the chief complaint.  I reviewed the recent office visit for sinusitis; unfortunately she is not improved. After review, looks like had sinus sxs dating back to June 2021; was treated for acute sinusitis at that time with doxy. She thinks she got a little better but never completely resolved. Then about 4-6 weeks ago, increase in sinus pain, clear pressure and dental pain. No cough or other URI sxs. Was then treated with pred burst and zpak. Only short lived and minimal improvement. Still with drainage and pain L>R. Uses allergy meds, flonase, saline washes. No dental issues that she is aware. No lung or breathing concerns.   Anxiety: going through a stressful time at work due to company being bought by another company. Having secondary trouble sleeping. Had been on lexapro in the past for adjustment insomnia and perimenopausal hot flushes but weaned off several months back; would prefer to NOT restart meds since she believes the work stressors will be short lived. Worries at night and can't sleep. No mood concerns and functioning well at work otherwise.   HRT: Loves it. No  more hot flushes.   I reviewed the patients updated PMH, FH, and SocHx.    Patient Active Problem List   Diagnosis Date Noted  . Essential hypertension 03/12/2017    Priority: High  . 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    Priority: High  . Obesity (BMI 30-39.9) 03/09/2016    Priority: High  . Family history of colon cancer 09/27/2013    Priority: High  . GERD (gastroesophageal reflux disease) 09/27/2013    Priority: Medium  . Irritable bowel syndrome 08/28/2010    Priority: Medium  . Migraine headache 02/24/2010    Priority: Medium  . Perimenopausal vasomotor symptoms 03/12/2017    Priority: Low  . Seasonal allergies 01/15/2014    Priority: Low  . 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 Adjustment insomnia 01/05/2019   Current Meds  Medication Sig  . dicyclomine (BENTYL) 10 MG capsule Take 1 capsule (10 mg total) by mouth 3 (three) times daily before meals. As needed  . estradiol (VIVELLE-DOT) 0.0375 MG/24HR Place 1 patch onto the skin 2 (two) times a week.  . fluticasone (FLONASE) 50 MCG/ACT nasal spray USE 1 SPRAY INTO BOTH NOSTRILS DAILY  . ibuprofen (ADVIL,MOTRIN) 200 MG tablet Take 400 mg by mouth as needed for headache, mild pain or moderate pain.   Marland Kitchen lisinopril-hydrochlorothiazide (ZESTORETIC) 20-25 MG tablet TAKE ONE TABLET BY MOUTH DAILY  . montelukast (SINGULAIR) 10 MG tablet Take 1 tablet (10 mg total) by mouth daily.  . SUMAtriptan (IMITREX) 100 MG tablet Take 100 mg by mouth every 2 (two)  hours as needed for migraine. May repeat in 2 hours if headache persists or recurs.   Marland Kitchen SYNTHROID 150 MCG tablet Take 150 mcg by mouth daily.  . Vitamin D, Ergocalciferol, (DRISDOL) 1.25 MG (50000 UNIT) CAPS capsule Take 50,000 Units by mouth once a week.    Review of Systems: Cardiovascular: negative for chest pain Respiratory: negative for SOB or persistent cough Gastrointestinal: negative for abdominal pain Genitourinary: negative for dysuria or gross  hematuria  Objective  Vitals: There were no vitals taken for this visit. General: no acute distress  Psych:  Alert and oriented, normal mood and affect   Assessment  1. Chronic sinusitis, unspecified location   2. Adjustment insomnia   3. Maxilla pain   4. Seasonal allergies   5. Perimenopausal vasomotor symptoms   6. Hormone replacement therapy (HRT)      Plan    Possible chronic sinusitis: but atypical. pred taper and sinus CT to clarify. Then abx or ENT referral or other depending upon results .  Continue allergy meds  Continue hrt fot hot flushes. Working well  Trial of intermittent low dose xanax for anxiety sxs related to work. If not resolved in next 4-6 weeks, rec restarting lexapro.  Follow up: No follow-ups on file.    Commons side effects, risks, benefits, and alternatives for medications and treatment plan prescribed today were discussed, and the patient expressed understanding of the given instructions. Patient is instructed to call or message via MyChart if he/she has any questions or concerns regarding our treatment plan. No barriers to understanding were identified. We discussed Red Flag symptoms and signs in detail. Patient expressed understanding regarding what to do in case of urgent or emergency type symptoms.   Medication list was reconciled, printed and provided to the patient in AVS. Patient instructions and summary information was reviewed with the patient as documented in the AVS. This note was prepared with assistance of Dragon voice recognition software. Occasional wrong-word or sound-a-like substitutions may have occurred due to the inherent limitations of voice recognition software  Orders Placed This Encounter  Procedures  . CT Maxillofacial WO CM   Meds ordered this encounter  Medications  . ALPRAZolam (XANAX) 0.5 MG tablet    Sig: Take 1 tablet (0.5 mg total) by mouth daily as needed for anxiety.    Dispense:  30 tablet    Refill:  0  .  predniSONE (DELTASONE) 10 MG tablet    Sig: Take 4 tabs qd x 2 days, 3 qd x 2 days, 2 qd x 2d, 1qd x 3 days    Dispense:  21 tablet    Refill:  0

## 2020-05-01 ENCOUNTER — Encounter: Payer: Self-pay | Admitting: Family Medicine

## 2020-05-02 ENCOUNTER — Telehealth: Payer: Self-pay

## 2020-05-02 IMAGING — DX DG ABDOMEN 2V
3 series · 3 of 3 positions shown · non-contrast
Comparison: None.

CLINICAL DATA: Right upper quadrant abdominal pain.

EXAM:
ABDOMEN - 2 VIEW

[abdomen standing ap]
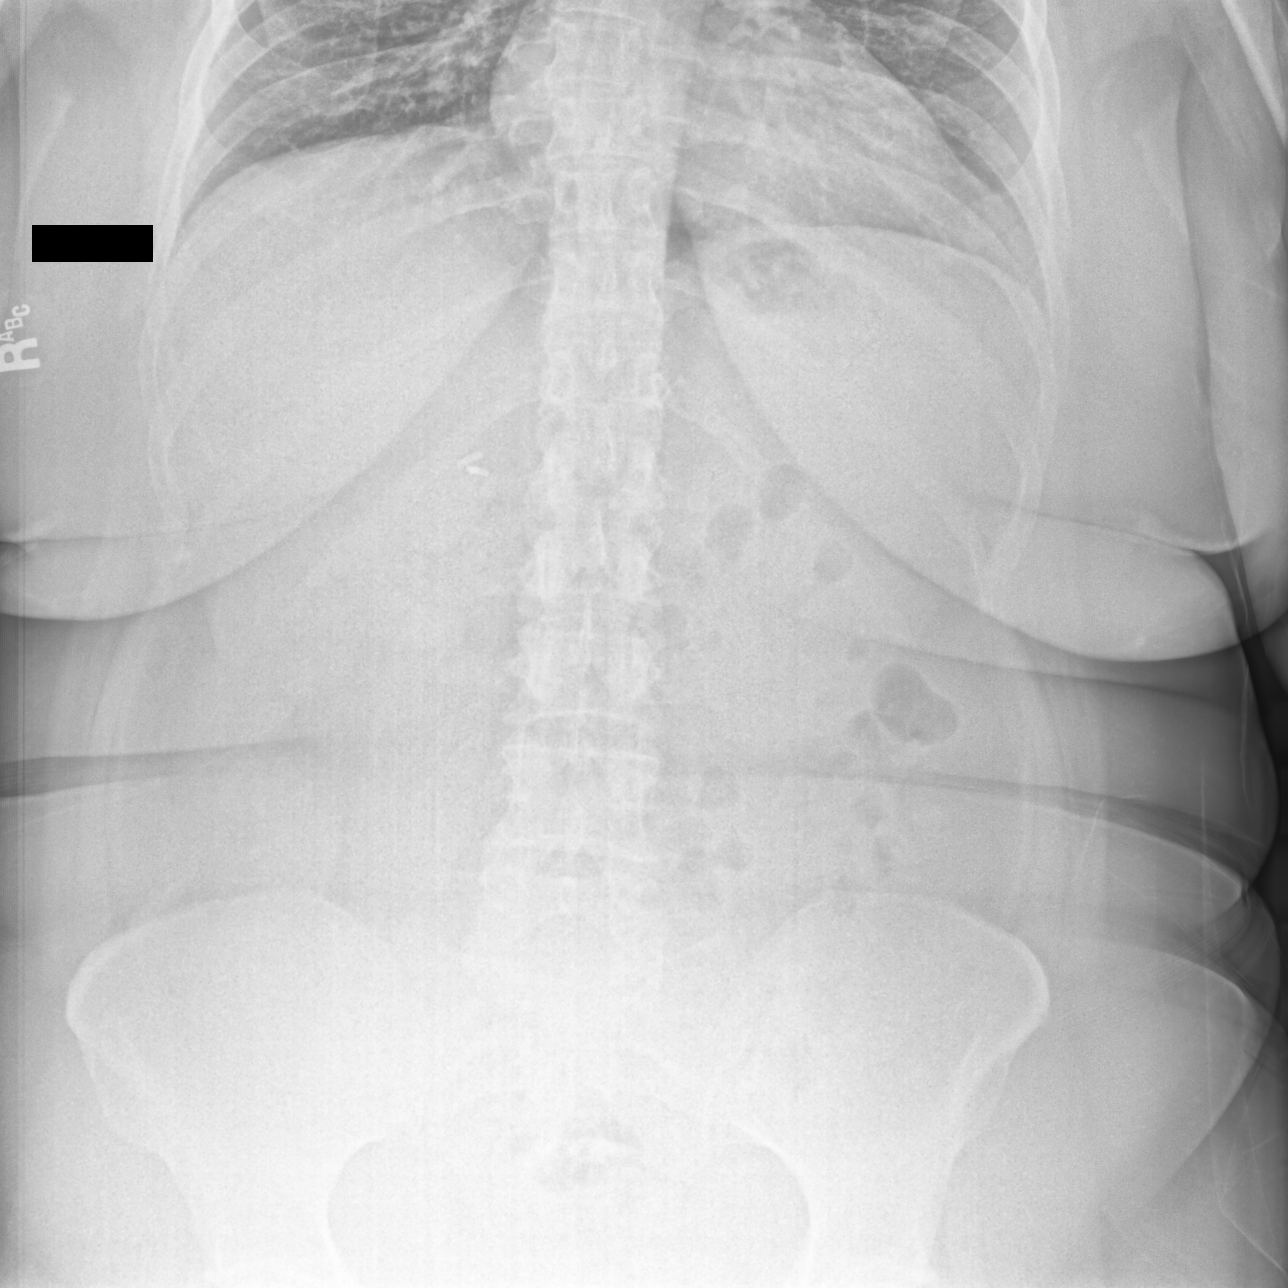

[kub ap (1 of 2)]
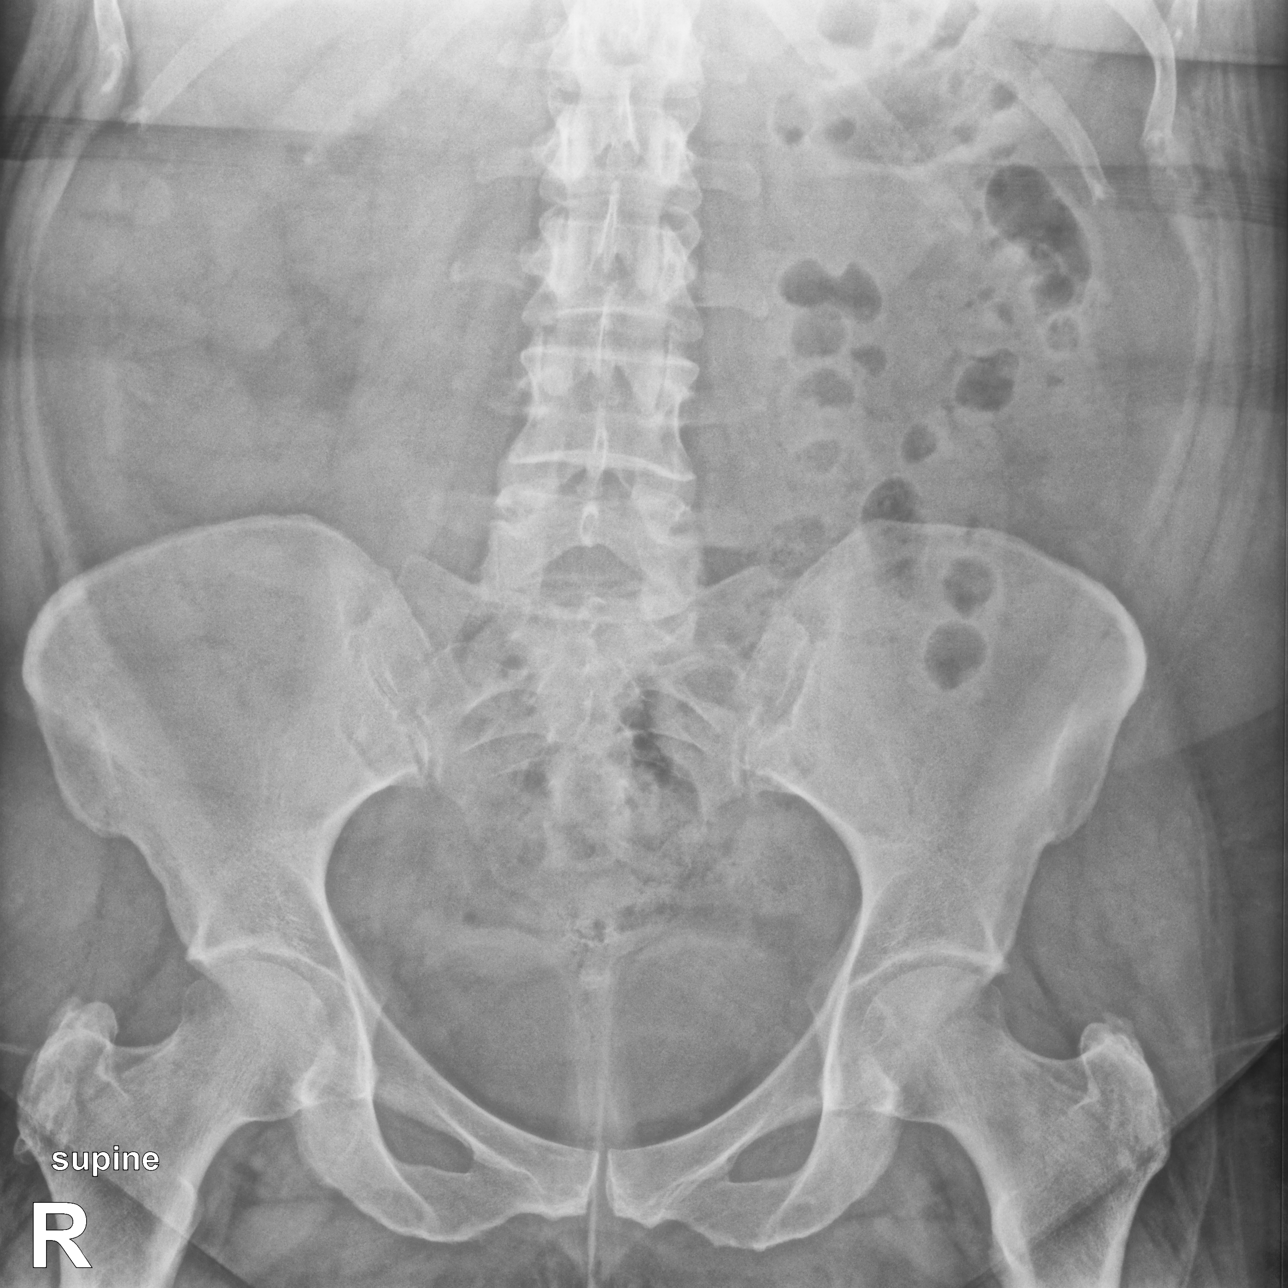

[kub ap (2 of 2)]
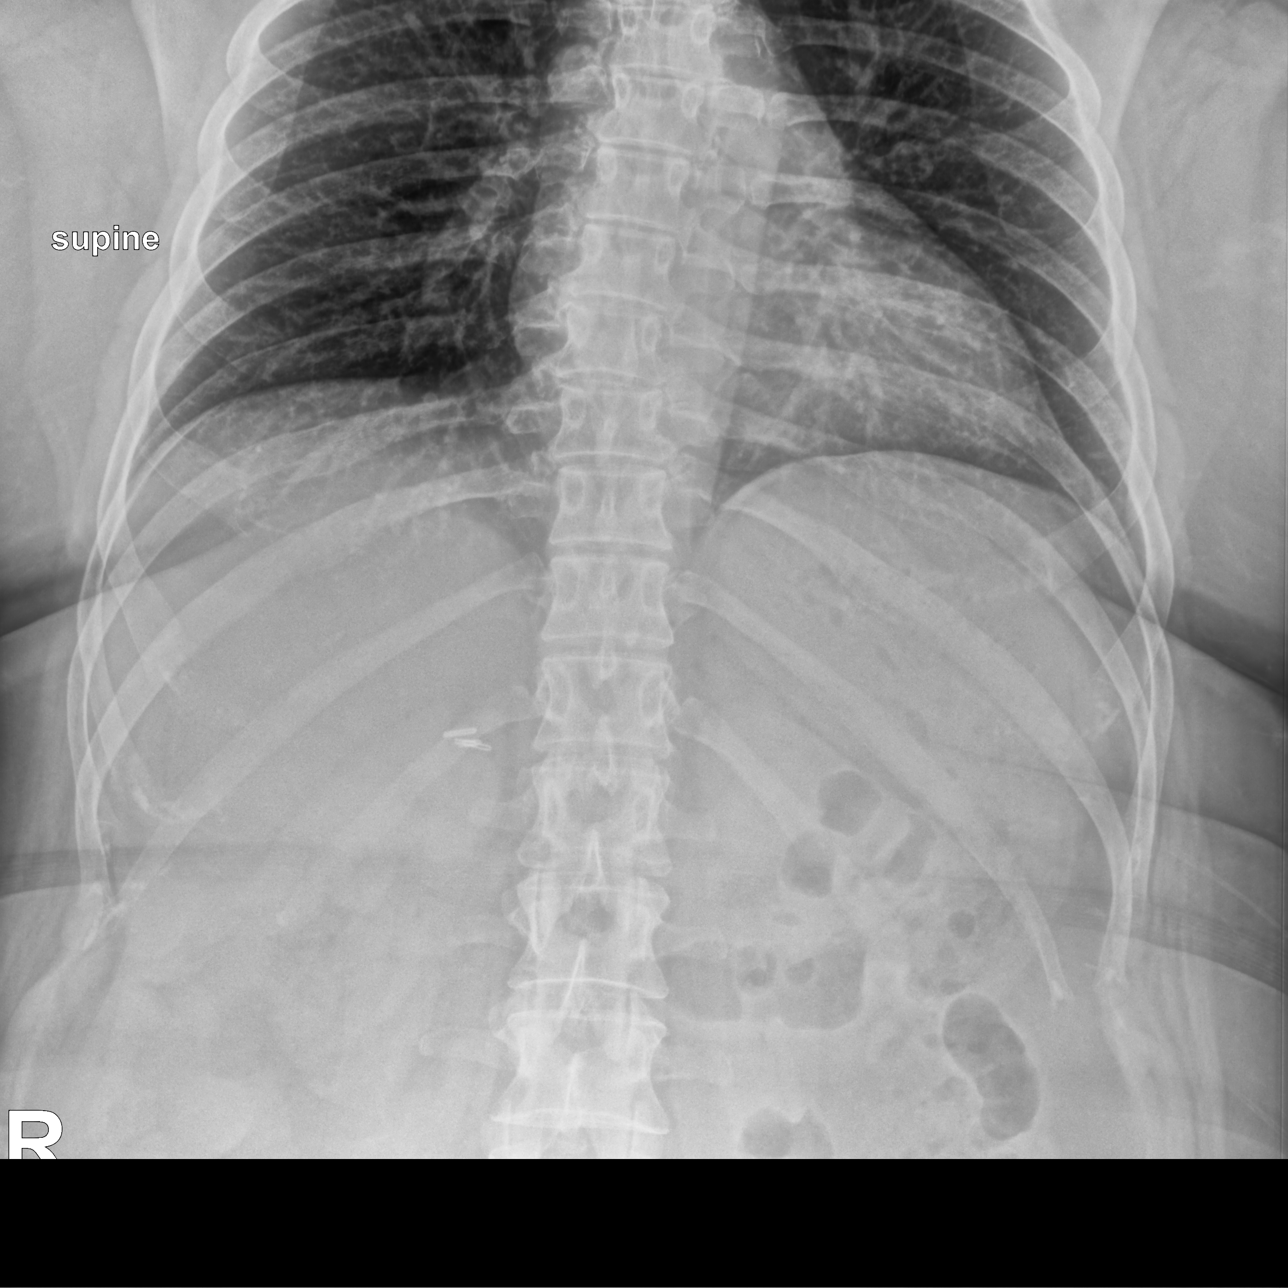

[3 of 3 positions shown; findings below may reference images not displayed]

FINDINGS: The bowel gas pattern is normal. There is no evidence of free air.
No radio-opaque calculi or other significant radiographic
abnormality is seen.
IMPRESSION: No evidence of bowel obstruction or ileus.

## 2020-05-02 NOTE — Telephone Encounter (Signed)
Pt is returning call.  

## 2020-05-03 ENCOUNTER — Other Ambulatory Visit: Payer: Self-pay

## 2020-05-03 DIAGNOSIS — R6884 Jaw pain: Secondary | ICD-10-CM

## 2020-05-03 NOTE — Telephone Encounter (Signed)
LMOVM advising patient to return my call

## 2020-05-03 NOTE — Telephone Encounter (Signed)
Patient informed that Angelica Lester refused CT scan. She is OK to ENT referral. Referral has been placed. Included that patient will be enrolled with [facility] [employer] starting 05/07/20

## 2020-06-05 DIAGNOSIS — J310 Chronic rhinitis: Secondary | ICD-10-CM | POA: Diagnosis not present

## 2020-06-05 DIAGNOSIS — J342 Deviated nasal septum: Secondary | ICD-10-CM | POA: Diagnosis not present

## 2020-06-05 DIAGNOSIS — J343 Hypertrophy of nasal turbinates: Secondary | ICD-10-CM | POA: Diagnosis not present

## 2020-06-24 ENCOUNTER — Other Ambulatory Visit: Payer: Self-pay

## 2020-06-24 ENCOUNTER — Ambulatory Visit
Admission: RE | Admit: 2020-06-24 | Discharge: 2020-06-24 | Disposition: A | Discharge status: Home / Self Care | Payer: BC Managed Care – PPO | Source: Ambulatory Visit | Attending: Family Medicine | Admitting: Family Medicine

## 2020-06-24 DIAGNOSIS — R6884 Jaw pain: Secondary | ICD-10-CM

## 2020-06-24 DIAGNOSIS — J322 Chronic ethmoidal sinusitis: Secondary | ICD-10-CM | POA: Diagnosis not present

## 2020-06-24 DIAGNOSIS — J320 Chronic maxillary sinusitis: Secondary | ICD-10-CM | POA: Diagnosis not present

## 2020-06-24 DIAGNOSIS — J342 Deviated nasal septum: Secondary | ICD-10-CM | POA: Diagnosis not present

## 2020-06-24 DIAGNOSIS — J329 Chronic sinusitis, unspecified: Secondary | ICD-10-CM

## 2020-06-24 DIAGNOSIS — J3489 Other specified disorders of nose and nasal sinuses: Secondary | ICD-10-CM | POA: Diagnosis not present

## 2020-07-02 DIAGNOSIS — J343 Hypertrophy of nasal turbinates: Secondary | ICD-10-CM | POA: Diagnosis not present

## 2020-07-02 DIAGNOSIS — J342 Deviated nasal septum: Secondary | ICD-10-CM | POA: Diagnosis not present

## 2020-07-02 DIAGNOSIS — J310 Chronic rhinitis: Secondary | ICD-10-CM | POA: Diagnosis not present

## 2020-07-08 ENCOUNTER — Encounter: Payer: Managed Care, Other (non HMO) | Admitting: Family Medicine

## 2020-08-14 ENCOUNTER — Telehealth (INDEPENDENT_AMBULATORY_CARE_PROVIDER_SITE_OTHER): Payer: BC Managed Care – PPO | Admitting: Physician Assistant

## 2020-08-14 ENCOUNTER — Encounter: Payer: Self-pay | Admitting: Physician Assistant

## 2020-08-14 VITALS — Temp 97.7°F | Ht 65.5 in | Wt 240.0 lb

## 2020-08-14 DIAGNOSIS — B349 Viral infection, unspecified: Secondary | ICD-10-CM

## 2020-08-14 MED ORDER — ONDANSETRON HCL 4 MG PO TABS: 4.0000 mg | ORAL_TABLET | Freq: Three times a day (TID) | ORAL | 1 refills | Status: DC | PRN

## 2020-08-14 NOTE — Progress Notes (Signed)
Virtual Visit via Video   I connected with Angelica Lester on 08/14/20 at 12:00 PM EST by a video enabled telemedicine application and verified that I am speaking with the correct person using two identifiers. Location patient: Home Location provider: [facility], Office Persons participating in the virtual visit: LEIA COLETTI, Inda Coke PA-C, Anselmo Pickler, LPN   I discussed the limitations of evaluation and management by telemedicine and the availability of in person appointments. The patient expressed understanding and agreed to proceed.  I acted as a Education administrator for [employer], PA-C [employer], LPN   Subjective:   HPI:   Patient is requesting evaluation for recent GI illness.  Symptom onset: Monday morning around 4:00 AM  Travel/contacts: She was exposed to stomach bug this past weekend. She was in [place] with family.  Vaccination status: yes  Patient endorses the following symptoms: Pt is c/o fever on Monday 101.5, vomited x 1 and diarrhea. Pt is still having nausea, diarrhea stopped yesterday. Fatigue  Patient denies the following symptoms: sinus headache, sinus congestion, rhinorrhea, ear pain, sore throat, wheezing, shortness of breath, chest tightness and chest pain, abd pain, rectal bleeding  Treatments tried: Emitrol for nausea but not helping.   Pushing fluids -- she is drinking plenty of water, gatorade, gingerale; reports adequate urination.  Patient risk factors: Current [id number] risk of complications score: 2 Smoking status: Angelica Lester  reports that she has never smoked. She has never used smokeless tobacco. If female, currently pregnant? []   Yes [x]   No  ROS: See pertinent positives and negatives per HPI.  Patient Active Problem List   Diagnosis Date Noted  . Adjustment insomnia 01/05/2019  . Essential hypertension 03/12/2017  . Perimenopausal vasomotor symptoms 03/12/2017  . 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 Obesity  (BMI 30-39.9) 03/09/2016  . Seasonal allergies 01/15/2014  . GERD (gastroesophageal reflux disease) 09/27/2013  . Family history of colon cancer 09/27/2013  . 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 Irritable bowel syndrome 08/28/2010  . Migraine headache 02/24/2010    Social History   Tobacco Use  . Smoking status: Never Smoker  . Smokeless tobacco: Never Used  Substance Use Topics  . Alcohol use: Yes    Alcohol/week: 3.0 standard drinks    Types: 3 Glasses of wine per week    Comment: occasionally    Current Outpatient Medications:  .  ALPRAZolam (XANAX) 0.5 MG tablet, Take 1 tablet (0.5 mg total) by mouth daily as needed for anxiety., Disp: 30 tablet, Rfl: 0 .  dicyclomine (BENTYL) 10 MG capsule, Take 1 capsule (10 mg total) by mouth 3 (three) times daily before meals. As needed, Disp: 60 capsule, Rfl: 1 .  estradiol (VIVELLE-DOT) 0.0375 MG/24HR, Place 1 patch onto the skin 2 (two) times a week., Disp: 8 patch, Rfl: 11 .  fluticasone (FLONASE) 50 MCG/ACT nasal spray, USE 1 SPRAY INTO BOTH NOSTRILS DAILY, Disp: 16 g, Rfl: 5 .  ibuprofen (ADVIL,MOTRIN) 200 MG tablet, Take 400 mg by mouth as needed for headache, mild pain or moderate pain. , Disp: , Rfl:  .  lisinopril-hydrochlorothiazide (ZESTORETIC) 20-25 MG tablet, TAKE ONE TABLET BY MOUTH DAILY, Disp: 90 tablet, Rfl: 3 .  montelukast (SINGULAIR) 10 MG tablet, Take 1 tablet (10 mg total) by mouth daily., Disp: 90 tablet, Rfl: 3 .  SUMAtriptan (IMITREX) 100 MG tablet, Take 100 mg by mouth every 2 (two) hours as needed for migraine. May repeat in 2 hours if headache persists or recurs. , Disp: ,  Rfl:  .  SYNTHROID 150 MCG tablet, Take 150 mcg by mouth daily., Disp: , Rfl:  .  ondansetron (ZOFRAN) 4 MG tablet, Take 1 tablet (4 mg total) by mouth every 8 (eight) hours as needed for nausea or vomiting., Disp: 30 tablet, Rfl: 1  Allergies  Allergen Reactions  . Fexofenadine Other (See Comments)    myalgias myalgias   . Amoxicillin Nausea  Only    Objective:   VITALS: Per patient if applicable, see vitals. GENERAL: Alert, appears well and in no acute distress. HEENT: Atraumatic, conjunctiva clear, no obvious abnormalities on inspection of external nose and ears. NECK: Normal movements of the head and neck. CARDIOPULMONARY: No increased WOB. Speaking in clear sentences. I:E ratio WNL.  MS: Moves all visible extremities without noticeable abnormality. PSYCH: Pleasant and cooperative, well-groomed. Speech normal rate and rhythm. Affect is appropriate. Insight and judgement are appropriate. Attention is focused, linear, and appropriate.  NEURO: CN grossly intact. Oriented as arrived to appointment on time with no prompting. Moves both UE equally.  SKIN: No obvious lesions, wounds, erythema, or cyanosis noted on face or hands.  Assessment and Plan:   Ardyce was seen today for covid symptoms.  Diagnoses and all orders for this visit:  Viral illness  Other orders -     ondansetron (ZOFRAN) 4 MG tablet; Take 1 tablet (4 mg total) by mouth every 8 (eight) hours as needed for nausea or vomiting.    No red flags on discussion, patient is not in any obvious distress during our visit. Discussed progression of most viral illness, and recommended supportive care at this point in time. I did howeverrx for oral zofran to help control nausea so she can start eating bland foods and advance diet as tolerated.  Discussed over the counter supportive care options, with recommendations to push fluids and rest. Reviewed return precautions including new/worsening fever, SOB, new/worsening cough, inability to tolerate POs, lack of urine output, dizziness or other concerns.  Recommended need to self-quarantine and practice social distancing until symptoms resolve. I recommend that patient follow-up if symptoms worsen or persist despite treatment x 7-10 days, sooner if needed.   I discussed the assessment and treatment plan with the patient.  The patient was provided an opportunity to ask questions and all were answered. The patient agreed with the plan and demonstrated an understanding of the instructions.   The patient was advised to call back or seek an in-person evaluation if the symptoms worsen or if the condition fails to improve as anticipated.   CMA or LPN served as scribe during this visit. History, Physical, and Plan performed by medical provider. The above documentation has been reviewed and is accurate and complete.  [place], [place] 08/14/2020

## 2020-09-02 ENCOUNTER — Other Ambulatory Visit: Payer: BC Managed Care – PPO

## 2020-09-02 DIAGNOSIS — Z20822 Contact with and (suspected) exposure to covid-19: Secondary | ICD-10-CM

## 2020-09-03 ENCOUNTER — Other Ambulatory Visit: Payer: BC Managed Care – PPO

## 2020-09-03 LAB — NOVEL CORONAVIRUS, NAA: SARS-CoV-2, NAA: NOT DETECTED

## 2020-09-03 LAB — SARS-COV-2, NAA 2 DAY TAT

## 2020-09-04 ENCOUNTER — Telehealth: Payer: Self-pay | Admitting: General Practice

## 2020-09-04 NOTE — Telephone Encounter (Signed)
Patient returned phone call about COVID results. Made her aware they were negative.

## 2020-09-05 DIAGNOSIS — Z03818 Encounter for observation for suspected exposure to other biological agents ruled out: Secondary | ICD-10-CM | POA: Diagnosis not present

## 2020-09-05 DIAGNOSIS — Z20822 Contact with and (suspected) exposure to covid-19: Secondary | ICD-10-CM | POA: Diagnosis not present

## 2020-09-25 ENCOUNTER — Encounter: Payer: Self-pay | Admitting: Family Medicine

## 2020-09-25 ENCOUNTER — Encounter: Payer: Managed Care, Other (non HMO) | Admitting: Family Medicine

## 2020-10-17 ENCOUNTER — Other Ambulatory Visit (HOSPITAL_COMMUNITY)
Admission: RE | Admit: 2020-10-17 | Discharge: 2020-10-17 | Disposition: A | Discharge status: Home / Self Care | Payer: BC Managed Care – PPO | Source: Ambulatory Visit | Attending: Family Medicine | Admitting: Family Medicine

## 2020-10-17 ENCOUNTER — Ambulatory Visit (INDEPENDENT_AMBULATORY_CARE_PROVIDER_SITE_OTHER): Payer: BC Managed Care – PPO | Admitting: Family Medicine

## 2020-10-17 ENCOUNTER — Other Ambulatory Visit: Payer: Self-pay

## 2020-10-17 ENCOUNTER — Encounter: Payer: Self-pay | Admitting: Family Medicine

## 2020-10-17 VITALS — BP 116/84 | HR 73 | Temp 98.2°F | Ht 66.0 in | Wt 239.2 lb

## 2020-10-17 DIAGNOSIS — R7989 Other specified abnormal findings of blood chemistry: Secondary | ICD-10-CM | POA: Diagnosis not present

## 2020-10-17 DIAGNOSIS — Z124 Encounter for screening for malignant neoplasm of cervix: Secondary | ICD-10-CM | POA: Diagnosis not present

## 2020-10-17 DIAGNOSIS — Z0000 Encounter for general adult medical examination without abnormal findings: Secondary | ICD-10-CM | POA: Diagnosis not present

## 2020-10-17 DIAGNOSIS — I10 Essential (primary) hypertension: Secondary | ICD-10-CM | POA: Diagnosis not present

## 2020-10-17 DIAGNOSIS — R87610 Atypical squamous cells of undetermined significance on cytologic smear of cervix (ASC-US): Secondary | ICD-10-CM | POA: Diagnosis not present

## 2020-10-17 DIAGNOSIS — N951 Menopausal and female climacteric states: Secondary | ICD-10-CM

## 2020-10-17 DIAGNOSIS — E890 Postprocedural hypothyroidism: Secondary | ICD-10-CM

## 2020-10-17 DIAGNOSIS — E669 Obesity, unspecified: Secondary | ICD-10-CM

## 2020-10-17 DIAGNOSIS — F5102 Adjustment insomnia: Secondary | ICD-10-CM

## 2020-10-17 DIAGNOSIS — K589 Irritable bowel syndrome without diarrhea: Secondary | ICD-10-CM

## 2020-10-17 LAB — COMPREHENSIVE METABOLIC PANEL
ALT: 48 U/L — ABNORMAL HIGH (ref 0–35)
AST: 42 U/L — ABNORMAL HIGH (ref 0–37)
Albumin: 4.4 g/dL (ref 3.5–5.2)
Alkaline Phosphatase: 67 U/L (ref 39–117)
BUN: 11 mg/dL (ref 6–23)
CO2: 29 mEq/L (ref 19–32)
Calcium: 9.6 mg/dL (ref 8.4–10.5)
Chloride: 90 mEq/L — ABNORMAL LOW (ref 96–112)
Creatinine, Ser: 0.79 mg/dL (ref 0.40–1.20)
GFR: 86.62 mL/min (ref >60.00)
Glucose, Bld: 90 mg/dL (ref 70–99)
Potassium: 3.3 mEq/L — ABNORMAL LOW (ref 3.5–5.1)
Sodium: 130 mEq/L — ABNORMAL LOW (ref 135–145)
Total Bilirubin: 0.5 mg/dL (ref 0.2–1.2)
Total Protein: 7.1 g/dL (ref 6.0–8.3)

## 2020-10-17 LAB — CBC WITH DIFFERENTIAL/PLATELET
Basophils Absolute: 0 10*3/uL (ref 0.0–0.1)
Basophils Relative: 0.5 % (ref 0.0–3.0)
Eosinophils Absolute: 0.2 10*3/uL (ref 0.0–0.7)
Eosinophils Relative: 3.2 % (ref 0.0–5.0)
HCT: 40.3 % (ref 36.0–46.0)
Hemoglobin: 13.9 g/dL (ref 12.0–15.0)
Lymphocytes Relative: 24 % (ref 12.0–46.0)
Lymphs Abs: 1.9 10*3/uL (ref 0.7–4.0)
MCHC: 34.4 g/dL (ref 30.0–36.0)
MCV: 86.7 fl (ref 78.0–100.0)
Monocytes Absolute: 0.4 10*3/uL (ref 0.1–1.0)
Monocytes Relative: 5.6 % (ref 3.0–12.0)
Neutro Abs: 5.2 10*3/uL (ref 1.4–7.7)
Neutrophils Relative %: 66.7 % (ref 43.0–77.0)
Platelets: 306 10*3/uL (ref 150.0–400.0)
RBC: 4.65 Mil/uL (ref 3.87–5.11)
RDW: 13.8 % (ref 11.5–15.5)
WBC: 7.7 10*3/uL (ref 4.0–10.5)

## 2020-10-17 LAB — LIPID PANEL
Cholesterol: 203 mg/dL — ABNORMAL HIGH (ref 0–200)
HDL: 51.4 mg/dL (ref >39.00)
LDL Cholesterol: 126 mg/dL — ABNORMAL HIGH (ref 0–99)
NonHDL: 151.59
Total CHOL/HDL Ratio: 4
Triglycerides: 127 mg/dL (ref 0.0–149.0)
VLDL: 25.4 mg/dL (ref 0.0–40.0)

## 2020-10-17 LAB — HEMOGLOBIN A1C: Hgb A1c MFr Bld: 5.6 % (ref 4.6–6.5)

## 2020-10-17 LAB — TSH: TSH: 1.77 u[IU]/mL (ref 0.35–4.50)

## 2020-10-17 NOTE — Progress Notes (Signed)
Subjective  Chief Complaint  Patient presents with  . Annual Exam    With a pap smear     HPI: Angelica Lester is a 50 y.o. female who presents to [facility] today for a Female Wellness Visit. She also has the concerns and/or needs as listed above in the chief complaint. These will be addressed in addition to the Health Maintenance Visit.   Wellness Visit: annual visit with health maintenance review and exam with Pap   HM: 50 year old perimenopausal female for physical with Pap smear.  Mammogram is up-to-date and normal.  She has had breast reduction surgery and has bilateral scar tissue that has been evaluated in the past.  No new lumps noticed.  No nipple discharge.  Immunizations are up-to-date.  Colon cancer screening is up-to-date.  Overall she feels well. Chronic disease f/u and/or acute problem visit: (deemed necessary to be done in addition to the wellness visit):  Perimenopausal vasomotor symptoms: These have been treated with hormone patch.  She missed 3 days, then had menstrual cycle.  Since she has not replaced the patch.  Hot flashes are mild at this time.  Her last normal menstrual cycle was February 2021.  Hypertension: Taking medication daily.  Feels well.  No chest pain, shortness of breath or lower extremity edema.  No adverse effects from medications.  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  Energy is good.  Weight is up but this is mostly because of her diet.  She is not currently exercising.  No heat or cold intolerances.  No skin or hair changes.  She is compliant with her medications.  Adjustment insomnia: Much improved.  Work environment has improved although she continues to be overwhelmed from the amount of work and high stress.  At times she will use half Xanax to help her sleep.  Her mood is otherwise well controlled.  She remains off Lexapro.  No longer having anxiety, panic or depressive  symptoms.  Normal bowel syndrome: Intermittent constipation but mostly well managed with food choices.  No abdominal pain or blood in stool.  Assessment  1. Annual physical exam   2. Cervical cancer screening   3. Essential hypertension   4. Postoperative hypothyroidism   5. Perimenopausal vasomotor symptoms   6. Adjustment insomnia   7. Irritable bowel syndrome, unspecified type   8. Obesity (BMI 30-39.9)      Plan  Female Wellness Visit:  Age appropriate Health Maintenance and Prevention measures were discussed with patient. Included topics are cancer screening recommendations, ways to keep healthy (see AVS) including dietary and exercise recommendations, regular eye and dental care, use of seat belts, and avoidance of moderate alcohol use and tobacco use.  Pap smear with HPV testing done today.  Other screens are up-to-date  BMI: discussed patient's BMI and encouraged positive lifestyle modifications to help get to or maintain a target BMI.  HM needs and immunizations were addressed and ordered. See below for orders. See HM and immunization section for updates.  All up-to-date  Routine labs and screening tests ordered including cmp, cbc and lipids where appropriate.  Discussed recommendations regarding Vit D and calcium supplementation (see AVS)  Chronic disease management visit and/or acute problem visit:  Hypertension is well controlled on lisinopril HCTZ.  She has had some hyponatremia.  Recheck renal function electrolytes today.  No change in dosing.  Hypothyroidism: Recheck TSH.  Clinically euthyroid.  Perimenopausal vasomotor symptoms: Currently manageable.  She will remain  off of HRT for the next 3 months.  Monitor cycles.  If no longer having cycles and menopausal symptoms worsen, will restart hormonal replacement therapy.  Adjustment insomnia: Mild.  Okay to continue intermittent low-dose Xanax.  If worsens, will return to discuss other management options.  IBS is  stable  Obesity: Discussed exercise and diet changes.   Follow up: Return in about 6 months (around 04/16/2021) for follow up Hypertension.  Orders Placed This Encounter  Procedures  . CBC with Differential/Platelet  . Comprehensive metabolic panel  . Lipid panel  . TSH  . Hemoglobin A1c   No orders of the defined types were placed in this encounter.     Body mass index is 38.61 kg/m. Wt Readings from Last 3 Encounters:  10/17/20 239 lb 3.2 oz (108.5 kg)  08/14/20 240 lb (108.9 kg)  03/20/20 237 lb (107.5 kg)     Patient Active Problem List   Diagnosis Date Noted  . Essential hypertension 03/12/2017    Priority: High  . 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 Obesity (BMI 30-39.9) 03/09/2016    Priority: High  . Family history of colon cancer 09/27/2013    Priority: High  . GERD (gastroesophageal reflux disease) 09/27/2013    Priority: Medium  . Irritable bowel syndrome 08/28/2010    Priority: Medium    Overview:  Constipation predominant   . Migraine headache 02/24/2010    Priority: Medium  . Perimenopausal vasomotor symptoms 03/12/2017    Priority: Low  . Seasonal allergies 01/15/2014    Priority: Low  . 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 Adjustment insomnia 01/05/2019   Health Maintenance  Topic Date Due  . MAMMOGRAM  01/30/2021  . PAP SMEAR-Modifier  03/15/2022  . TETANUS/TDAP  05/18/2029  . COLONOSCOPY (Pts 45-44yrs Insurance coverage will need to be confirmed)  06/27/2029  . INFLUENZA VACCINE  Completed  . COVID-19 Vaccine  Completed  . Hepatitis C Screening  Completed  . HIV Screening  Completed   Immunization History  Administered Date(s) Administered  . Influenza Split 06/25/2011  . Influenza, Seasonal, Injecte, Preservative Fre 07/09/2015  . Influenza,inj,Quad PF,6+ Mos 06/21/2014, 05/19/2019, 06/12/2019  . Influenza-Unspecified  06/07/2017, 09/13/2020  . PFIZER(Purple Top)SARS-COV-2 Vaccination 11/22/2019, 12/06/2019, 09/13/2020  . Tdap 09/08/2003, 03/08/2012, 05/19/2019  . Zoster Recombinat (Shingrix) 06/12/2019, 09/27/2019   We updated and reviewed the patient's past history in detail and it is documented below. Allergies: Patient is allergic to fexofenadine and amoxicillin. 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 Past Surgical History Patient  has a past surgical history that includes Total thyroidectomy (10/19/2016); Mohs surgery (Right); Dilation and curettage of uterus (X 1); Thyroidectomy (N/A, 10/19/2016); Laparoscopic cholecystectomy (2005); and Breast surgery (12/14/2017). Family History: Patient family history includes Alzheimer's disease in her paternal grandmother; Atrial fibrillation in her father and mother; COPD in her mother; Colon cancer in her paternal grandfather; Heart disease in her sister; Hyperlipidemia in her mother; Hypertension in her mother; Liver disease in her sister. Social History:  Patient  reports that she has never smoked. She has never used smokeless tobacco. She reports current alcohol use of about 3.0 standard drinks of alcohol per week. She reports that  she does not use drugs.  Review of Systems: Constitutional: negative for fever or malaise Ophthalmic: negative for photophobia, double vision or loss of vision Cardiovascular: negative for chest pain, dyspnea on exertion, or new LE swelling Respiratory: negative for SOB or persistent cough Gastrointestinal: negative for abdominal pain, change in bowel habits or melena Genitourinary: negative for dysuria or gross hematuria, no abnormal  uterine bleeding or disharge Musculoskeletal: negative for new gait disturbance or muscular weakness Integumentary: negative for new or persistent rashes, no breast lumps Neurological: negative for TIA or stroke symptoms Psychiatric: negative for SI or delusions Allergic/Immunologic: negative for hives  Patient Care Team    Relationship Specialty Notifications Start End  Leamon Arnt, MD PCP - General Family Medicine  10/12/16   Madelin Rear, MD Consulting Physician Endocrinology  10/01/17   Jari Pigg, MD Consulting Physician Dermatology  10/17/20     Objective  Vitals: BP 116/84   Pulse 73   Temp 98.2 F (36.8 C) (Temporal)   Ht 5\' 6"  (1.676 m)   Wt 239 lb 3.2 oz (108.5 kg)   SpO2 96%   BMI 38.61 kg/m  General:  Well developed, well nourished, no acute distress  Psych:  Alert and orientedx3,normal mood and affect HEENT:  Normocephalic, atraumatic, non-icteric sclera,  supple neck without adenopathy, mass or thyromegaly Cardiovascular:  Normal S1, S2, RRR without gallop, rub or murmur Respiratory:  Good breath sounds bilaterally, CTAB with normal respiratory effort Gastrointestinal: normal bowel sounds, soft, non-tender, no noted masses. No HSM MSK: no deformities, contusions. Joints are without erythema or swelling.  Skin:  Warm, no rashes or suspicious lesions noted Neurologic:    Mental status is normal. CN 2-11 are normal. Gross motor and sensory exams are normal. Normal gait. No tremor Breast Exam: No mass, skin retraction or nipple discharge is appreciated in either breast. No axillary adenopathy. Fibrocystic changes are not noted.  Bilaterally there is scar tissue masses noted Pelvic Exam: Normal external genitalia, no vulvar or vaginal lesions present. Clear nulliparous cervix w/o CMT. Bimanual exam reveals a nontender fundus w/o masses, nl size. No adnexal masses present. No inguinal adenopathy. A PAP smear was performed.    Commons side effects, risks, benefits,  and alternatives for medications and treatment plan prescribed today were discussed, and the patient expressed understanding of the given instructions. Patient is instructed to call or message via MyChart if he/she has any questions or concerns regarding our treatment plan. No barriers to understanding were identified. We discussed Red Flag symptoms and signs in detail. Patient expressed understanding regarding what to do in case of urgent or emergency type symptoms.   Medication list was reconciled, printed and provided to the patient in AVS. Patient instructions and summary information was reviewed with the patient as documented in the AVS. This note was prepared with assistance of Dragon voice recognition software. Occasional wrong-word or sound-a-like substitutions may have occurred due to the inherent limitations of voice recognition software  This visit occurred during the SARS-CoV-2 public health emergency.  Safety protocols were in place, including screening questions prior to the visit, additional usage of staff PPE, and extensive cleaning of exam room while observing appropriate contact time as indicated for disinfecting solutions.

## 2020-10-17 NOTE — Patient Instructions (Signed)
Please return in 6 months for hypertension follow up.  I will release your lab results to you on your MyChart account with further instructions. Please reply with any questions.   If you have any questions or concerns, please don't hesitate to send me a message via MyChart or call the office at [phone]. Thank you for visiting with [place] today! It's our pleasure caring for you.   Preventive Care 15-52 Years Old, Female Preventive care refers to lifestyle choices and visits with your health care provider that can promote health and wellness. This includes:  A yearly physical exam. This is also called an annual wellness visit.  Regular dental and eye exams.  Immunizations.  Screening for certain conditions.  Healthy lifestyle choices, such as: ? Eating a healthy diet. ? Getting regular exercise. ? Not using drugs or products that contain nicotine and tobacco. ? Limiting alcohol use. What can I expect for my preventive care visit? Physical exam Your health care provider will check your:  Height and weight. These may be used to calculate your BMI (body mass index). BMI is a measurement that tells if you are at a healthy weight.  Heart rate and blood pressure.  Body temperature.  Skin for abnormal spots. Counseling Your health care provider may ask you questions about your:  Past medical problems.  Family's medical history.  Alcohol, tobacco, and drug use.  Emotional well-being.  Home life and relationship well-being.  Sexual activity.  Diet, exercise, and sleep habits.  Work and work Statistician.  Access to firearms.  Method of birth control.  Menstrual cycle.  Pregnancy history. What immunizations do I need? Vaccines are usually given at various ages, according to a schedule. Your health care provider will recommend vaccines for you based on your age, medical history, and lifestyle or other factors, such as travel or where you work.   What tests do I  need? Blood tests  Lipid and cholesterol levels. These may be checked every 5 years, or more often if you are over 27 years old.  Hepatitis C test.  Hepatitis B test. Screening  Lung cancer screening. You may have this screening every year starting at age 27 if you have a 30-pack-year history of smoking and currently smoke or have quit within the past 15 years.  Colorectal cancer screening. ? All adults should have this screening starting at age 37 and continuing until age 37. ? Your health care provider may recommend screening at age 89 if you are at increased risk. ? You will have tests every 1-10 years, depending on your results and the type of screening test.  Diabetes screening. ? This is done by checking your blood sugar (glucose) after you have not eaten for a while (fasting). ? You may have this done every 1-3 years.  Mammogram. ? This may be done every 1-2 years. ? Talk with your health care provider about when you should start having regular mammograms. This may depend on whether you have a family history of breast cancer.  BRCA-related cancer screening. This may be done if you have a family history of breast, ovarian, tubal, or peritoneal cancers.  Pelvic exam and Pap test. ? This may be done every 3 years starting at age 46. ? Starting at age 64, this may be done every 5 years if you have a Pap test in combination with an HPV test. Other tests  STD (sexually transmitted disease) testing, if you are at risk.  Bone density scan. This is  done to screen for osteoporosis. You may have this scan if you are at high risk for osteoporosis. Talk with your health care provider about your test results, treatment options, and if necessary, the need for more tests. Follow these instructions at home: Eating and drinking  Eat a diet that includes fresh fruits and vegetables, whole grains, lean protein, and low-fat dairy products.  Take vitamin and mineral supplements as  recommended by your health care provider.  Do not drink alcohol if: ? Your health care provider tells you not to drink. ? You are pregnant, may be pregnant, or are planning to become pregnant.  If you drink alcohol: ? Limit how much you have to 0-1 drink a day. ? Be aware of how much alcohol is in your drink. In the [place] one drink equals one 12 oz bottle of beer (355 mL), one 5 oz glass of wine (148 mL), or one 1 oz glass of hard liquor (44 mL).   Lifestyle  Take daily care of your teeth and gums. Brush your teeth every morning and night with fluoride toothpaste. Floss one time each day.  Stay active. Exercise for at least 30 minutes 5 or more days each week.  Do not use any products that contain nicotine or tobacco, such as cigarettes, e-cigarettes, and chewing tobacco. If you need help quitting, ask your health care provider.  Do not use drugs.  If you are sexually active, practice safe sex. Use a condom or other form of protection to prevent STIs (sexually transmitted infections).  If you do not wish to become pregnant, use a form of birth control. If you plan to become pregnant, see your health care provider for a prepregnancy visit.  If told by your health care provider, take low-dose aspirin daily starting at age 80.  Find healthy ways to cope with stress, such as: ? Meditation, yoga, or listening to music. ? Journaling. ? Talking to a trusted person. ? Spending time with friends and family. Safety  Always wear your seat belt while driving or riding in a vehicle.  Do not drive: ? If you have been drinking alcohol. Do not ride with someone who has been drinking. ? When you are tired or distracted. ? While texting.  Wear a helmet and other protective equipment during sports activities.  If you have firearms in your house, make sure you follow all gun safety procedures. What's next?  Visit your health care provider once a year for an annual wellness visit.  Ask your  health care provider how often you should have your eyes and teeth checked.  Stay up to date on all vaccines. This information is not intended to replace advice given to you by your health care provider. Make sure you discuss any questions you have with your health care provider. Document Revised: 05/28/2020 Document Reviewed: 05/05/2018 Elsevier Patient Education  2021 [employer].

## 2020-10-22 LAB — CYTOLOGY - PAP
Comment: NEGATIVE
Diagnosis: UNDETERMINED — AB
High risk HPV: NEGATIVE

## 2020-10-23 DIAGNOSIS — R7989 Other specified abnormal findings of blood chemistry: Secondary | ICD-10-CM | POA: Insufficient documentation

## 2020-10-23 DIAGNOSIS — R87610 Atypical squamous cells of undetermined significance on cytologic smear of cervix (ASC-US): Secondary | ICD-10-CM | POA: Insufficient documentation

## 2020-10-23 DIAGNOSIS — K760 Fatty (change of) liver, not elsewhere classified: Secondary | ICD-10-CM | POA: Insufficient documentation

## 2020-10-23 MED ORDER — LISINOPRIL-HYDROCHLOROTHIAZIDE 20-12.5 MG PO TABS: 1.0000 | ORAL_TABLET | Freq: Every day | ORAL | 3 refills | Status: DC

## 2020-10-23 NOTE — Addendum Note (Signed)
Addended by: Billey Chang on: 10/23/2020 08:29 AM   Modules accepted: Orders

## 2020-10-25 ENCOUNTER — Encounter: Payer: Self-pay | Admitting: Family Medicine

## 2020-10-25 ENCOUNTER — Other Ambulatory Visit: Payer: Self-pay

## 2020-10-25 DIAGNOSIS — R748 Abnormal levels of other serum enzymes: Secondary | ICD-10-CM

## 2020-10-28 ENCOUNTER — Other Ambulatory Visit: Payer: Self-pay

## 2020-10-28 ENCOUNTER — Encounter: Payer: Self-pay | Admitting: Family Medicine

## 2020-10-28 ENCOUNTER — Ambulatory Visit (HOSPITAL_BASED_OUTPATIENT_CLINIC_OR_DEPARTMENT_OTHER)
Admission: RE | Admit: 2020-10-28 | Discharge: 2020-10-28 | Disposition: A | Discharge status: Home / Self Care | Payer: BC Managed Care – PPO | Source: Ambulatory Visit | Attending: Family Medicine | Admitting: Family Medicine

## 2020-10-28 DIAGNOSIS — R945 Abnormal results of liver function studies: Secondary | ICD-10-CM | POA: Diagnosis not present

## 2020-10-28 DIAGNOSIS — R748 Abnormal levels of other serum enzymes: Secondary | ICD-10-CM | POA: Diagnosis not present

## 2020-11-13 DIAGNOSIS — L578 Other skin changes due to chronic exposure to nonionizing radiation: Secondary | ICD-10-CM | POA: Diagnosis not present

## 2020-11-13 DIAGNOSIS — D2262 Melanocytic nevi of left upper limb, including shoulder: Secondary | ICD-10-CM | POA: Diagnosis not present

## 2020-11-13 DIAGNOSIS — D225 Melanocytic nevi of trunk: Secondary | ICD-10-CM | POA: Diagnosis not present

## 2020-11-13 DIAGNOSIS — D485 Neoplasm of uncertain behavior of skin: Secondary | ICD-10-CM | POA: Diagnosis not present

## 2020-11-13 DIAGNOSIS — L570 Actinic keratosis: Secondary | ICD-10-CM | POA: Diagnosis not present

## 2020-11-18 ENCOUNTER — Other Ambulatory Visit: Payer: Self-pay | Admitting: Family Medicine

## 2020-11-18 NOTE — Telephone Encounter (Signed)
Last refill: 04/30/20 #30, 0 Last OV: 10/17/20 dx. CPE

## 2020-12-02 ENCOUNTER — Encounter: Payer: Self-pay | Admitting: Family Medicine

## 2020-12-16 ENCOUNTER — Encounter: Payer: Self-pay | Admitting: Family Medicine

## 2020-12-16 ENCOUNTER — Other Ambulatory Visit: Payer: Self-pay

## 2020-12-16 ENCOUNTER — Ambulatory Visit: Payer: BC Managed Care – PPO | Admitting: Family Medicine

## 2020-12-16 VITALS — BP 130/88 | HR 69 | Temp 98.2°F | Wt 239.8 lb

## 2020-12-16 DIAGNOSIS — N951 Menopausal and female climacteric states: Secondary | ICD-10-CM

## 2020-12-16 DIAGNOSIS — K760 Fatty (change of) liver, not elsewhere classified: Secondary | ICD-10-CM | POA: Diagnosis not present

## 2020-12-16 DIAGNOSIS — I10 Essential (primary) hypertension: Secondary | ICD-10-CM | POA: Diagnosis not present

## 2020-12-16 DIAGNOSIS — E876 Hypokalemia: Secondary | ICD-10-CM | POA: Diagnosis not present

## 2020-12-16 DIAGNOSIS — E871 Hypo-osmolality and hyponatremia: Secondary | ICD-10-CM

## 2020-12-16 MED ORDER — HYDROCHLOROTHIAZIDE 12.5 MG PO CAPS: 12.5000 mg | ORAL_CAPSULE | Freq: Every day | ORAL | 3 refills | Status: DC

## 2020-12-16 MED ORDER — LISINOPRIL 40 MG PO TABS: 40.0000 mg | ORAL_TABLET | Freq: Every day | ORAL | 3 refills | Status: DC

## 2020-12-16 NOTE — Progress Notes (Signed)
Subjective  CC:  Chief Complaint  Patient presents with  . Hypertension    8 weeks since start zestoretic 20-12.5. needing to recheck potassium and sodium levels. 130's/90's at home BP readings. States she has felt "hot" since changing lowering medication dose   . Elevated Hepatic Enzymes    HPI: Angelica Lester is a 50 y.o. female who presents to the office today to address the problems listed above in the chief complaint.  Hypertension f/u: Control is fair .  8 weeks ago at her physical, she was hyponatremic and hypokalemic on ACE inhibitor with HCTZ 25 mg daily.  I lowered the HCTZ component.  She is tolerating this well but noticed the diastolics are elevated at home.  She feels more flushing heeded.  No chest pain or shortness of breath.  Perimenopausal hot flushes: She had been on the hormonal patch which was controlling symptoms but then she had a heavy period in January and stopped it.  She is in no further episodes of bleeding but does admit to hot flashes interfering with sleep at night.  Overall feels warmer in general.  Fatty liver: Discussed results of ultrasound showing diffuse echogenic hepatic changes consistent with NASH.  She is working on diet and weight loss.  Eating vegetables and fruits daily now.  Low-carb low-fat.  Assessment  1. Essential hypertension   2. Hyponatremia   3. Hypokalemia   4. Hepatic steatosis   5. Hot flushes, perimenopausal      Plan    Hypertension f/u: BP control is fairly well controlled. Adjust lisinopril from 20 to 40 mg daily.  Continue HCTZ 12.5 daily and recheck potassium and sodium levels.  Recommend low-sodium diet and weight loss will continue to help her blood pressure control.  Recheck 3 months  Fatty liver: Praised for weight loss and low-fat diet.  Recheck electrolytes; should be improved since decreasing dose of thiazide diuretic  Perimenopausal sxs: educated and  Counseling done. Check FSH. Restart patch vs gabapenting  pending results.    Education regarding management of these chronic disease states was given. Management strategies discussed on successive visits include dietary and exercise recommendations, goals of achieving and maintaining IBW, and lifestyle modifications aiming for adequate sleep and minimizing stressors.   Follow up: 3 months for bp recheck  Orders Placed This Encounter  Procedures  . Comprehensive metabolic panel  . Follicle stimulating hormone   Meds ordered this encounter  Medications  . lisinopril (ZESTRIL) 40 MG tablet    Sig: Take 1 tablet (40 mg total) by mouth daily.    Dispense:  90 tablet    Refill:  3  . hydrochlorothiazide (MICROZIDE) 12.5 MG capsule    Sig: Take 1 capsule (12.5 mg total) by mouth daily.    Dispense:  90 capsule    Refill:  3      BP Readings from Last 3 Encounters:  12/16/20 130/88  10/17/20 116/84  03/20/20 120/84   Wt Readings from Last 3 Encounters:  12/16/20 239 lb 12.8 oz (108.8 kg)  10/17/20 239 lb 3.2 oz (108.5 kg)  08/14/20 240 lb (108.9 kg)    Lab Results  Component Value Date   CHOL 203 (H) 10/17/2020   CHOL 209 (H) 04/05/2019   CHOL 194 03/29/2018   Lab Results  Component Value Date   HDL 51.40 10/17/2020   HDL 57.40 04/05/2019   HDL 56.50 03/29/2018   Lab Results  Component Value Date   LDLCALC 126 (H) 10/17/2020  LDLCALC 112 (H) 04/05/2019   LDLCALC 111 (H) 03/29/2018   Lab Results  Component Value Date   TRIG 127.0 10/17/2020   TRIG 198.0 (H) 04/05/2019   TRIG 131.0 03/29/2018   Lab Results  Component Value Date   CHOLHDL 4 10/17/2020   CHOLHDL 4 04/05/2019   CHOLHDL 3 03/29/2018   Lab Results  Component Value Date   LDLDIRECT 137.0 10/01/2017   Lab Results  Component Value Date   CREATININE 0.79 10/17/2020   BUN 11 10/17/2020   NA 130 (L) 10/17/2020   K 3.3 (L) 10/17/2020   CL 90 (L) 10/17/2020   CO2 29 10/17/2020    The 10-year ASCVD risk score [name], et al., 2013) is: 2.1%    Values used to calculate the score:     Age: 36 years     Sex: Female     Is Non-Hispanic African American: No     Diabetic: No     Tobacco smoker: No     Systolic Blood Pressure: 355 mmHg     Is BP treated: Yes     HDL Cholesterol: 51.4 mg/dL     Total Cholesterol: 203 mg/dL  I reviewed the patients updated PMH, FH, and SocHx.    Patient Active Problem List   Diagnosis Date Noted  . Essential hypertension 03/12/2017    Priority: High  . 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    Priority: High  . Obesity (BMI 30-39.9) 03/09/2016    Priority: High  . Family history of colon cancer 09/27/2013    Priority: High  . Hepatic steatosis 10/23/2020    Priority: Medium  . GERD (gastroesophageal reflux disease) 09/27/2013    Priority: Medium  . Irritable bowel syndrome 08/28/2010    Priority: Medium  . Migraine headache 02/24/2010    Priority: Medium  . Perimenopausal vasomotor symptoms 03/12/2017    Priority: Low  . Seasonal allergies 01/15/2014    Priority: Low  . 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 ASCUS of cervix with negative high risk HPV 10/23/2020  . Adjustment insomnia 01/05/2019    Allergies: Fexofenadine and Amoxicillin  Social History: Patient  reports that she has never smoked. She has never used smokeless tobacco. She reports current alcohol use of about 3.0 standard drinks of alcohol per week. She reports that she does not use drugs.  Current Meds  Medication Sig  . albuterol (VENTOLIN HFA) 108 (90 Base) MCG/ACT inhaler Inhale into the lungs every 6 (six) hours as needed for wheezing or shortness of breath.  . ALPRAZolam (XANAX) 0.5 MG tablet TAKE ONE TABLET BY MOUTH DAILY AS NEEDED FOR ANXIETY  . dicyclomine (BENTYL) 10 MG capsule Take 1 capsule (10 mg total) by mouth 3 (three) times daily before meals. As needed  . fluticasone (FLONASE) 50 MCG/ACT nasal spray USE 1 SPRAY INTO BOTH NOSTRILS DAILY  . hydrochlorothiazide (MICROZIDE) 12.5 MG capsule  Take 1 capsule (12.5 mg total) by mouth daily.  Marland Kitchen ibuprofen (ADVIL,MOTRIN) 200 MG tablet Take 400 mg by mouth as needed for headache, mild pain or moderate pain.   Marland Kitchen lisinopril (ZESTRIL) 40 MG tablet Take 1 tablet (40 mg total) by mouth daily.  . montelukast (SINGULAIR) 10 MG tablet Take 1 tablet (10 mg total) by mouth daily.  . ondansetron (ZOFRAN) 4 MG tablet Take 1 tablet (4 mg total) by mouth every 8 (eight) hours as needed for nausea or vomiting.  . SUMAtriptan (IMITREX) 100 MG tablet Take 100 mg by  mouth every 2 (two) hours as needed for migraine. May repeat in 2 hours if headache persists or recurs.  Marland Kitchen SYNTHROID 150 MCG tablet Take 150 mcg by mouth daily.  . [DISCONTINUED] estradiol (VIVELLE-DOT) 0.0375 MG/24HR Place 1 patch onto the skin 2 (two) times a week.  . [DISCONTINUED] lisinopril-hydrochlorothiazide (ZESTORETIC) 20-12.5 MG tablet Take 1 tablet by mouth daily.    Review of Systems: Cardiovascular: negative for chest pain, palpitations, leg swelling, orthopnea Respiratory: negative for SOB, wheezing or persistent cough Gastrointestinal: negative for abdominal pain Genitourinary: negative for dysuria or gross hematuria  Objective  Vitals: BP 130/88   Pulse 69   Temp 98.2 F (36.8 C) (Temporal)   Wt 239 lb 12.8 oz (108.8 kg)   SpO2 98%   BMI 38.70 kg/m  General: no acute distress  Psych:  Alert and oriented, normal mood and affect   Commons side effects, risks, benefits, and alternatives for medications and treatment plan prescribed today were discussed, and the patient expressed understanding of the given instructions. Patient is instructed to call or message via MyChart if he/she has any questions or concerns regarding our treatment plan. No barriers to understanding were identified. We discussed Red Flag symptoms and signs in detail. Patient expressed understanding regarding what to do in case of urgent or emergency type symptoms.   Medication list was reconciled,  printed and provided to the patient in AVS. Patient instructions and summary information was reviewed with the patient as documented in the AVS. This note was prepared with assistance of Dragon voice recognition software. Occasional wrong-word or sound-a-like substitutions may have occurred due to the inherent limitations of voice recognition software  This visit occurred during the SARS-CoV-2 public health emergency.  Safety protocols were in place, including screening questions prior to the visit, additional usage of staff PPE, and extensive cleaning of exam room while observing appropriate contact time as indicated for disinfecting solutions.

## 2020-12-16 NOTE — Patient Instructions (Addendum)
Please return in 3 months for blood pressure recheck. Sooner if BP remains elevated.   I will release your lab results to you on your MyChart account with further instructions. Please reply with any questions.   I will order gabapentin or restart the patch once I recheck your hormone levels.   If you have any questions or concerns, please don't hesitate to send me a message via MyChart or call the office at [phone]. Thank you for visiting with Angelica Lester today! It's our pleasure caring for you.

## 2020-12-17 LAB — COMPREHENSIVE METABOLIC PANEL
ALT: 40 U/L — ABNORMAL HIGH (ref 0–35)
AST: 27 U/L (ref 0–37)
Albumin: 4.3 g/dL (ref 3.5–5.2)
Alkaline Phosphatase: 80 U/L (ref 39–117)
BUN: 10 mg/dL (ref 6–23)
CO2: 27 mEq/L (ref 19–32)
Calcium: 9.8 mg/dL (ref 8.4–10.5)
Chloride: 96 mEq/L (ref 96–112)
Creatinine, Ser: 0.77 mg/dL (ref 0.40–1.20)
GFR: 89.22 mL/min (ref >60.00)
Glucose, Bld: 84 mg/dL (ref 70–99)
Potassium: 3.9 mEq/L (ref 3.5–5.1)
Sodium: 134 mEq/L — ABNORMAL LOW (ref 135–145)
Total Bilirubin: 0.4 mg/dL (ref 0.2–1.2)
Total Protein: 7 g/dL (ref 6.0–8.3)

## 2020-12-17 LAB — FOLLICLE STIMULATING HORMONE: FSH: 46.6 m[IU]/mL

## 2020-12-23 ENCOUNTER — Encounter: Payer: BC Managed Care – PPO | Admitting: Family Medicine

## 2020-12-23 DIAGNOSIS — E039 Hypothyroidism, unspecified: Secondary | ICD-10-CM | POA: Diagnosis not present

## 2020-12-23 DIAGNOSIS — E559 Vitamin D deficiency, unspecified: Secondary | ICD-10-CM | POA: Diagnosis not present

## 2020-12-30 DIAGNOSIS — E039 Hypothyroidism, unspecified: Secondary | ICD-10-CM | POA: Diagnosis not present

## 2020-12-30 DIAGNOSIS — Z6841 Body Mass Index (BMI) 40.0 and over, adult: Secondary | ICD-10-CM | POA: Diagnosis not present

## 2020-12-30 DIAGNOSIS — E890 Postprocedural hypothyroidism: Secondary | ICD-10-CM | POA: Diagnosis not present

## 2020-12-30 DIAGNOSIS — Z8639 Personal history of other endocrine, nutritional and metabolic disease: Secondary | ICD-10-CM | POA: Diagnosis not present

## 2021-01-28 DIAGNOSIS — D485 Neoplasm of uncertain behavior of skin: Secondary | ICD-10-CM | POA: Diagnosis not present

## 2021-01-28 DIAGNOSIS — L988 Other specified disorders of the skin and subcutaneous tissue: Secondary | ICD-10-CM | POA: Diagnosis not present

## 2021-02-10 ENCOUNTER — Telehealth (INDEPENDENT_AMBULATORY_CARE_PROVIDER_SITE_OTHER): Payer: BC Managed Care – PPO | Admitting: Family Medicine

## 2021-02-10 ENCOUNTER — Encounter: Payer: Self-pay | Admitting: Family Medicine

## 2021-02-10 ENCOUNTER — Other Ambulatory Visit: Payer: Self-pay

## 2021-02-10 DIAGNOSIS — U071 COVID-19: Secondary | ICD-10-CM | POA: Diagnosis not present

## 2021-02-10 DIAGNOSIS — Z20822 Contact with and (suspected) exposure to covid-19: Secondary | ICD-10-CM | POA: Diagnosis not present

## 2021-02-10 MED ORDER — ALBUTEROL SULFATE HFA 108 (90 BASE) MCG/ACT IN AERS: 2.0000 | INHALATION_SPRAY | Freq: Four times a day (QID) | RESPIRATORY_TRACT | 2 refills | Status: DC | PRN

## 2021-02-10 MED ORDER — GUAIFENESIN-CODEINE 100-10 MG/5ML PO SOLN: 5.0000 mL | Freq: Four times a day (QID) | ORAL | 0 refills | Status: DC | PRN

## 2021-02-10 MED ORDER — PREDNISONE 10 MG PO TABS: ORAL_TABLET | ORAL | 0 refills | Status: DC

## 2021-02-10 NOTE — Progress Notes (Signed)
Virtual Visit via Video Note  Subjective  CC:  Chief Complaint  Patient presents with   Covid Positive    Tested positive 6/5, symptoms started 6/4 Sinus congestion, headache, productive cough  Denies any fevers      I connected with Angelica Lester on 02/17/21 at  4:15 PM EDT by a video enabled telemedicine application and verified that I am speaking with the correct person using two identifiers. Location patient: Home Location provider:  [facility], Office Persons participating in the virtual visit: Angelica Lester, Leamon Arnt, MD Nichols  I discussed the limitations of evaluation and management by telemedicine and the availability of in person appointments. The patient expressed understanding and agreed to proceed. HPI: Angelica Lester is a 50 y.o. female who was contacted today to address the problems listed above in the chief complaint. 50 year old female with COVID infection: Upper URI symptoms with significant cough.  No atypical features.  Risk factors include hypertension and obesity.  She is fully vaccinated.  No shortness of breath or dyspnea on exertion.  No chest pain.  Eating and drinking well.  We discussed benefits versus risks of antiviral treatments. Assessment  1. COVID-19 virus infection      Plan  COVID infection: Doing well.  Discussed risk versus benefits of medications.  This time elected to treat supportively.  Given history of prolonged cough, will treat with prednisone, cough syrup and albuterol as needed.  She will let me know if symptoms worsen.  I discussed the assessment and treatment plan with the patient. The patient was provided an opportunity to ask questions and all were answered. The patient agreed with the plan and demonstrated an understanding of the instructions.   The patient was advised to call back or seek an in-person evaluation if the symptoms worsen or if the condition fails to improve as  anticipated. Follow up: No follow-ups on file.  Visit date not found  Meds ordered this encounter  Medications   guaiFENesin-codeine 100-10 MG/5ML syrup    Sig: Take 5 mLs by mouth every 6 (six) hours as needed for cough.    Dispense:  120 mL    Refill:  0   predniSONE (DELTASONE) 10 MG tablet    Sig: Take 4 tabs qd x 2 days, 3 qd x 2 days, 2 qd x 2d, 1qd x 3 days    Dispense:  21 tablet    Refill:  0   albuterol (VENTOLIN HFA) 108 (90 Base) MCG/ACT inhaler    Sig: Inhale 2 puffs into the lungs every 6 (six) hours as needed for wheezing or shortness of breath.    Dispense:  1 each    Refill:  2      I reviewed the patients updated PMH, FH, and SocHx.    Patient Active Problem List   Diagnosis Date Noted   Essential hypertension 03/12/2017    Priority: High   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    Priority: High   Obesity (BMI 30-39.9) 03/09/2016    Priority: High   Family history of colon cancer 09/27/2013    Priority: High   Hepatic steatosis 10/23/2020    Priority: Medium   GERD (gastroesophageal reflux disease) 09/27/2013    Priority: Medium   Irritable bowel syndrome 08/28/2010    Priority: Medium   Migraine headache 02/24/2010    Priority: Medium   Perimenopausal vasomotor symptoms 03/12/2017    Priority: Low   Seasonal allergies  01/15/2014    Priority: Low   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 As needed   fluticasone (FLONASE) 50 MCG/ACT nasal spray USE 1 SPRAY INTO BOTH NOSTRILS DAILY   guaiFENesin-codeine 100-10 MG/5ML syrup Take 5 mLs by mouth every 6 (six) hours as needed for cough.   hydrochlorothiazide (MICROZIDE) 12.5 MG  capsule Take 1 capsule (12.5 mg total) by mouth daily.   ibuprofen (ADVIL,MOTRIN) 200 MG tablet Take 400 mg by mouth as needed for headache, mild pain or moderate pain.    lisinopril (ZESTRIL) 40 MG tablet Take 1 tablet (40 mg total) by mouth daily.   montelukast (SINGULAIR) 10 MG tablet Take 1 tablet (10 mg total) by mouth daily.   ondansetron (ZOFRAN) 4 MG tablet Take 1 tablet (4 mg total) by mouth every 8 (eight) hours as needed for nausea or vomiting.   predniSONE (DELTASONE) 10 MG tablet Take 4 tabs qd x 2 days, 3 qd x 2 days, 2 qd x 2d, 1qd x 3 days   SUMAtriptan (IMITREX) 100 MG tablet Take 100 mg by mouth every 2 (two) hours as needed for migraine. May repeat in 2 hours if headache persists or recurs.   SYNTHROID 150 MCG tablet Take 150 mcg by mouth daily.   [DISCONTINUED] albuterol (VENTOLIN HFA) 108 (90 Base) MCG/ACT inhaler Inhale into the lungs every 6 (six) hours as needed for wheezing or shortness of breath.    Allergies: Patient is allergic to fexofenadine and amoxicillin. Family History: Patient family history includes Alzheimer's disease in her paternal grandmother; Atrial fibrillation in her father and mother; COPD in her mother; Colon cancer in her paternal grandfather; Heart disease in her sister; Hyperlipidemia in her mother; Hypertension in her mother; Liver disease in her sister. Social History:  Patient  reports that she has never smoked. She has never used smokeless tobacco. She reports current alcohol use of about 3.0 standard drinks of alcohol per week. She reports that she does not use drugs.  Review of Systems: Constitutional: Negative for fever malaise or anorexia Cardiovascular: negative for chest pain Respiratory: negative for SOB or persistent cough Gastrointestinal: negative for abdominal pain  OBJECTIVE Vitals: There were no vitals taken for this visit. General: no acute distress , A&Ox3  Leamon Arnt, MD

## 2021-02-17 ENCOUNTER — Encounter: Payer: Self-pay | Admitting: Family Medicine

## 2021-02-17 MED ORDER — VALACYCLOVIR HCL 500 MG PO TABS: 500.0000 mg | ORAL_TABLET | Freq: Two times a day (BID) | ORAL | 0 refills | Status: AC

## 2021-02-27 DIAGNOSIS — Z1231 Encounter for screening mammogram for malignant neoplasm of breast: Secondary | ICD-10-CM | POA: Diagnosis not present

## 2021-02-27 LAB — HM MAMMOGRAPHY

## 2021-04-03 ENCOUNTER — Encounter: Payer: Self-pay | Admitting: Family Medicine

## 2021-05-20 ENCOUNTER — Ambulatory Visit: Payer: BC Managed Care – PPO | Admitting: Family Medicine

## 2021-05-20 ENCOUNTER — Ambulatory Visit (HOSPITAL_COMMUNITY)
Admission: RE | Admit: 2021-05-20 | Discharge: 2021-05-20 | Disposition: A | Discharge status: Home / Self Care | Payer: BC Managed Care – PPO | Source: Ambulatory Visit | Attending: Family Medicine | Admitting: Family Medicine

## 2021-05-20 ENCOUNTER — Other Ambulatory Visit: Payer: Self-pay

## 2021-05-20 ENCOUNTER — Encounter: Payer: Self-pay | Admitting: Family Medicine

## 2021-05-20 VITALS — BP 124/86 | HR 81 | Temp 98.0°F | Ht 66.0 in | Wt 244.2 lb

## 2021-05-20 DIAGNOSIS — J219 Acute bronchiolitis, unspecified: Secondary | ICD-10-CM | POA: Diagnosis not present

## 2021-05-20 DIAGNOSIS — M79662 Pain in left lower leg: Secondary | ICD-10-CM

## 2021-05-20 DIAGNOSIS — F5102 Adjustment insomnia: Secondary | ICD-10-CM

## 2021-05-20 MED ORDER — GUAIFENESIN-CODEINE 100-10 MG/5ML PO SOLN
5.0000 mL | Freq: Four times a day (QID) | ORAL | 0 refills | Status: DC | PRN
Start: 2021-05-20 — End: 2021-07-28

## 2021-05-20 MED ORDER — PREDNISONE 20 MG PO TABS: ORAL_TABLET | ORAL | 0 refills | Status: DC

## 2021-05-20 MED ORDER — DICLOFENAC SODIUM 75 MG PO TBEC: 75.0000 mg | DELAYED_RELEASE_TABLET | Freq: Two times a day (BID) | ORAL | 0 refills | Status: DC

## 2021-05-20 MED ORDER — AZITHROMYCIN 250 MG PO TABS: ORAL_TABLET | ORAL | 0 refills | Status: DC

## 2021-05-20 NOTE — Patient Instructions (Addendum)
Please follow up if symptoms do not improve or as needed.    Try the diclofenac to help relieve the pain.   Start the medications for bronchitis.   Get the ultrasound done as scheduled and I will let you know the results.

## 2021-05-20 NOTE — Progress Notes (Signed)
Subjective  CC:  Chief Complaint  Patient presents with   Leg Pain    Left leg, 3 weeks    HPI: Angelica Lester is a 50 y.o. female who presents to the office today to address the problems listed above in the chief complaint. C/o 3 weeks of left posterior calf pain. Pain upon awakening and worse with walking but can have pain at rest. No significant swelling . No exercise or injury. No recent travel. No redness in skin or systemic sxs. Has tried ice and tylenol w/o any relief. Pain is mild to moderate and intermittent.  3 days of bronchitic sxs :drainage, sinus pain, hefty cough productive of yellow sputum. Gets significant bronchospasm and has been using her inahler. No sob or fevers. H/o same requiring pred and cough meds. Can become protracted.no gi sxs. Up to date on covid vaccines.  Insomnia: new concern. Awakens in middle of night and hard to return to sleep; stress/work related. ? Snoring.    Assessment  1. Pain of left calf   2. Acute bronchiolitis with bronchospasm   3. Adjustment insomnia      Plan  Calf pain:  at risk for dvt: check dopplers. No deformity on exam. No MOI to explain calf injury. Voltaren and doppler and stretching. Further eval pending results or if persists.  Bronchitis: zpak, pred burst, rob ac and add ayrtec for allergies Insomnia, counseling and sleep hygiene reviewed. Trial of otc meds recommended. F/u if not improving. Consider sleep test to r/o  osa   Follow up: prn  Visit date not found  Orders Placed This Encounter  Procedures   VAS [place] LOWER EXTREMITY VENOUS (DVT)   Meds ordered this encounter  Medications   azithromycin (ZITHROMAX) 250 MG tablet    Sig: Take 2 tabs today, then 1 tab daily for 4 days    Dispense:  1 each    Refill:  0   predniSONE (DELTASONE) 20 MG tablet    Sig: Take 3 tabs daily for 5 days    Dispense:  15 tablet    Refill:  0   guaiFENesin-codeine 100-10 MG/5ML syrup    Sig: Take 5 mLs by mouth every 6 (six) hours  as needed for cough.    Dispense:  120 mL    Refill:  0   diclofenac (VOLTAREN) 75 MG EC tablet    Sig: Take 1 tablet (75 mg total) by mouth 2 (two) times daily.    Dispense:  30 tablet    Refill:  0      I reviewed the patients updated PMH, FH, and SocHx.    Patient Active Problem List   Diagnosis Date Noted   Essential hypertension 03/12/2017    Priority: High   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    Priority: High   Obesity (BMI 30-39.9) 03/09/2016    Priority: High   Family history of colon cancer 09/27/2013    Priority: High   Hepatic steatosis 10/23/2020    Priority: Medium   GERD (gastroesophageal reflux disease) 09/27/2013    Priority: Medium   Irritable bowel syndrome 08/28/2010    Priority: Medium   Migraine headache 02/24/2010    Priority: Medium   Perimenopausal vasomotor symptoms 03/12/2017    Priority: Low   Seasonal allergies 01/15/2014    Priority: Low   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   ALPRAZolam (XANAX) 0.5 MG tablet TAKE ONE TABLET BY MOUTH DAILY AS NEEDED FOR ANXIETY   azithromycin (ZITHROMAX) 250 MG tablet Take 2 tabs today, then 1 tab daily for 4 days   diclofenac (VOLTAREN) 75 MG EC tablet Take 1 tablet (75 mg total) by mouth 2 (two) times daily.   dicyclomine (BENTYL) 10 MG capsule Take 1 capsule (10 mg total) by mouth 3 (three) times daily before meals. As needed   fluticasone (FLONASE) 50 MCG/ACT nasal spray USE 1 SPRAY INTO BOTH NOSTRILS DAILY   guaiFENesin-codeine 100-10 MG/5ML syrup Take 5 mLs by mouth every 6 (six) hours as needed for cough.   hydrochlorothiazide (MICROZIDE) 12.5 MG capsule Take 1 capsule (12.5 mg total) by mouth daily.   ibuprofen  (ADVIL,MOTRIN) 200 MG tablet Take 400 mg by mouth as needed for headache, mild pain or moderate pain.    lisinopril (ZESTRIL) 40 MG tablet Take 1 tablet (40 mg total) by mouth daily.   montelukast (SINGULAIR) 10 MG tablet Take 1 tablet (10 mg total) by mouth daily.   ondansetron (ZOFRAN) 4 MG tablet Take 1 tablet (4 mg total) by mouth every 8 (eight) hours as needed for nausea or vomiting.   predniSONE (DELTASONE) 20 MG tablet Take 3 tabs daily for 5 days   SYNTHROID 150 MCG tablet Take 150 mcg by mouth daily.    Allergies: Patient is allergic to fexofenadine and amoxicillin. Family History: Patient family history includes Alzheimer's disease in her paternal grandmother; Atrial fibrillation in her father and mother; COPD in her mother; Colon cancer in her paternal grandfather; Heart disease in her sister; Hyperlipidemia in her mother; Hypertension in her mother; Liver disease in her sister. Social History:  Patient  reports that she has never smoked. She has never used smokeless tobacco. She reports current alcohol use of about 3.0 standard drinks per week. She reports that she does not use drugs.  Review of Systems: Constitutional: Negative for fever malaise or anorexia Cardiovascular: negative for chest pain Respiratory: negative for SOB or persistent cough Gastrointestinal: negative for abdominal pain  Objective  Vitals: BP 124/86   Pulse 81   Temp 98 F (36.7 C) (Temporal)   Ht '5\' 6"'$  (1.676 m)   Wt 244 lb 3.2 oz (110.8 kg)   SpO2 95%   BMI 39.41 kg/m  General: no acute distress , A&Ox3, hacking hearty cough present w/o dyspnea HEENT: PEERL, conjunctiva normal, neck is supple Cardiovascular:  RRR without murmur or gallop.  Respiratory:  Good breath sounds bilaterally, CTAB with normal respiratory effort Skin:  Warm, no rashes Left calf is tender w/o cord, deformity, erythema or warmth. No edema. + homans.     Commons side effects, risks, benefits, and alternatives for  medications and treatment plan prescribed today were discussed, and the patient expressed understanding of the given instructions. Patient is instructed to call or message via MyChart if he/she has any questions or concerns regarding our treatment plan. No barriers to understanding were identified. We discussed Red Flag symptoms and signs in detail. Patient expressed understanding regarding what to do in case of urgent or emergency type symptoms.  Medication list was reconciled, printed and provided to the patient in AVS. Patient instructions and summary information was reviewed with the patient as documented in the AVS. This note was prepared  with assistance of Systems analyst. Occasional wrong-word or sound-a-like substitutions may have occurred due to the inherent limitations of voice recognition software  This visit occurred during the SARS-CoV-2 public health emergency.  Safety protocols were in place, including screening questions prior to the visit, additional usage of staff PPE, and extensive cleaning of exam room while observing appropriate contact time as indicated for disinfecting solutions.

## 2021-07-07 ENCOUNTER — Other Ambulatory Visit: Payer: Self-pay | Admitting: Family Medicine

## 2021-07-07 NOTE — Telephone Encounter (Signed)
Last Refill 11/18/2020 Last OV 05/20/2021 dx pain of left calf

## 2021-07-28 ENCOUNTER — Other Ambulatory Visit: Payer: Self-pay

## 2021-07-28 ENCOUNTER — Ambulatory Visit: Payer: BC Managed Care – PPO | Admitting: Physician Assistant

## 2021-07-28 ENCOUNTER — Encounter: Payer: Self-pay | Admitting: Physician Assistant

## 2021-07-28 VITALS — BP 122/90 | HR 70 | Temp 98.0°F | Ht 66.0 in | Wt 247.2 lb

## 2021-07-28 DIAGNOSIS — R350 Frequency of micturition: Secondary | ICD-10-CM | POA: Diagnosis not present

## 2021-07-28 DIAGNOSIS — R300 Dysuria: Secondary | ICD-10-CM

## 2021-07-28 DIAGNOSIS — M545 Low back pain, unspecified: Secondary | ICD-10-CM

## 2021-07-28 LAB — POCT URINALYSIS DIPSTICK
Bilirubin, UA: NEGATIVE
Blood, UA: NEGATIVE
Glucose, UA: NEGATIVE
Ketones, UA: NEGATIVE
Leukocytes, UA: NEGATIVE
Nitrite, UA: NEGATIVE
Protein, UA: NEGATIVE
Spec Grav, UA: <=1.005 — AB (ref 1.010–1.025)
Urobilinogen, UA: 0.2 E.U./dL
pH, UA: 6 (ref 5.0–8.0)

## 2021-07-28 NOTE — Patient Instructions (Signed)
It was great to see you!  Push fluids and continue to work on regular bowel movements  Restart diclofenac for your back pain for a few days  If new/worsening symptoms, let us know ASAP  I will be in touch with your urine culture results  General instructions Make sure you: Pee until your bladder is empty. Do not hold pee for a long time. Empty your bladder after sex. Wipe from front to back after pooping if you are a female. Use each tissue one time when you wipe. Drink enough fluid to keep your pee pale yellow. Keep all follow-up visits as told by your doctor. This is important. Contact a doctor if: You do not get better after 1-2 days. Your symptoms go away and then come back. Get help right away if: You have very bad back pain. You have very bad pain in your lower belly. You have a fever. You are sick to your stomach (nauseous). You are throwing up.    Take care,  Inda Coke PA-C

## 2021-07-28 NOTE — Progress Notes (Signed)
Angelica Lester is a 50 y.o. female here for dysuria.   History of Present Illness:   Chief Complaint  Patient presents with   Dysuria    Pt started with symptoms Friday night with pelvic spasms then  on Saturday, burning with urination and frequency. Woke up today c/o right flank pain. Denies fever or chills, no hematuria.     Dysuria  Angelica Lester presents with c/o dysuria that has been onset since Friday night. According to pt, sx started with pelvic spasms Friday night and transitioned into burning with urination and frequency the next day. Initially she believed it to be a UTI since she hadn't had as much water in the week prior to sx starting. She has been constipated but believes this to be caused by her IBS. Admits that constipation was worse over the weekend. She has not taken any OTC medications, instead pushing more fluids. Denies fever, chills, n/v, vaginal discharge, vaginal bleeding, hematuria, hx of kidney stones, or hx of UTIs.   Right Flank Pain Upon waking up this morning she began experiencing right flank pain. States the pain feels like a dull ache and says it hasn't worsened since this morning. She believed this could be related to her recent urinary sx. Denies recent injury. She admits that she spent a lot of time on the couch yesterday -- more than normal. Denies new incontinence of bowel/bladder.   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 Wilson Singer, endocrine   PONV (postoperative nausea and vomiting)    Skin cancer    "right arm" (10/19/2016)     Social History   Tobacco Use   Smoking status: Never   Smokeless tobacco: Never  Vaping Use   Vaping Use: Never used  Substance Use  Topics   Alcohol use: Yes    Alcohol/week: 3.0 standard drinks    Types: 3 Glasses of wine per week    Comment: occasionally   Drug use: No    Past Surgical History:  Procedure Laterality Date   BREAST SURGERY  12/14/2017   Reduction   DILATION AND CURETTAGE OF UTERUS  X 1   LAPAROSCOPIC CHOLECYSTECTOMY  2005   MOHS SURGERY Right    arm   THYROIDECTOMY N/A 10/19/2016   Procedure: TOTAL THYROIDECTOMY;  Surgeon: Armandina Gemma, MD;  Location: [facility];  Service: General;  Laterality: N/A;   TOTAL THYROIDECTOMY  10/19/2016    Family History  Problem Relation Age of Onset   Atrial fibrillation Mother    COPD Mother    Hyperlipidemia Mother    Hypertension Mother    Atrial fibrillation Father    Heart disease Sister    Alzheimer's disease Paternal Grandmother    Colon cancer Paternal Grandfather    Liver disease Sister        Liver Transplant    Allergies  Allergen Reactions   Fexofenadine Other (See Comments)    myalgias myalgias    Amoxicillin Nausea Only    Current Medications:   Current Outpatient Medications:    albuterol (VENTOLIN HFA) 108 (90 Base) MCG/ACT inhaler, Inhale 2 puffs into the lungs every 6 (  six) hours as needed for wheezing or shortness of breath., Disp: 1 each, Rfl: 2   ALPRAZolam (XANAX) 0.5 MG tablet, TAKE ONE TABLET BY MOUTH DAILY AS NEEDED FOR ANXIETY, Disp: 30 tablet, Rfl: 2   diclofenac (VOLTAREN) 75 MG EC tablet, Take 1 tablet (75 mg total) by mouth 2 (two) times daily., Disp: 30 tablet, Rfl: 0   dicyclomine (BENTYL) 10 MG capsule, Take 1 capsule (10 mg total) by mouth 3 (three) times daily before meals. As needed, Disp: 60 capsule, Rfl: 1   fluticasone (FLONASE) 50 MCG/ACT nasal spray, USE 1 SPRAY INTO BOTH NOSTRILS DAILY, Disp: 16 g, Rfl: 5   hydrochlorothiazide (MICROZIDE) 12.5 MG capsule, Take 1 capsule (12.5 mg total) by mouth daily., Disp: 90 capsule, Rfl: 3   ibuprofen (ADVIL,MOTRIN) 200 MG tablet, Take 400 mg by mouth as needed for  headache, mild pain or moderate pain. , Disp: , Rfl:    lisinopril (ZESTRIL) 40 MG tablet, Take 1 tablet (40 mg total) by mouth daily., Disp: 90 tablet, Rfl: 3   montelukast (SINGULAIR) 10 MG tablet, Take 1 tablet (10 mg total) by mouth daily., Disp: 90 tablet, Rfl: 3   ondansetron (ZOFRAN) 4 MG tablet, Take 1 tablet (4 mg total) by mouth every 8 (eight) hours as needed for nausea or vomiting., Disp: 30 tablet, Rfl: 1   SYNTHROID 150 MCG tablet, Take 150 mcg by mouth daily., Disp: , Rfl:    Review of Systems:   Review of Systems  Genitourinary:  Positive for dysuria.  Negative unless otherwise specified per HPI.  Vitals:   There were no vitals filed for this visit.   There is no height or weight on file to calculate BMI.  Physical Exam:   Physical Exam Vitals and nursing note reviewed.  Constitutional:      General: She is not in acute distress.    Appearance: She is well-developed. She is not ill-appearing or toxic-appearing.  Cardiovascular:     Rate and Rhythm: Normal rate and regular rhythm.     Pulses: Normal pulses.     Heart sounds: Normal heart sounds, S1 normal and S2 normal.  Pulmonary:     Effort: Pulmonary effort is normal.     Breath sounds: Normal breath sounds.  Abdominal:     General: Abdomen is flat. Bowel sounds are normal.     Palpations: Abdomen is soft.     Tenderness: There is no abdominal tenderness. There is no right CVA tenderness or left CVA tenderness.  Musculoskeletal:     Cervical back: Normal.     Thoracic back: No bony tenderness.     Lumbar back: No bony tenderness.     Comments: Decreased ROM 2/2 pain with flexion/extension, lateral side bends, and rotation. Reproducible tenderness with deep palpation to r upper lumbar paraspinal muscles. No bony tenderness. No evidence of erythema, rash or ecchymosis.   Skin:    General: Skin is warm and dry.  Neurological:     Mental Status: She is alert.     GCS: GCS eye subscore is 4. GCS verbal  subscore is 5. GCS motor subscore is 6.  Psychiatric:        Speech: Speech normal.        Behavior: Behavior normal. Behavior is cooperative.   Results for orders placed or performed in visit on 07/28/21  POCT urinalysis dipstick  Result Value Ref Range   Color, UA clear    Clarity, UA clear    Glucose, UA  Negative Negative   Bilirubin, UA neg    Ketones, UA neg    Spec Grav, UA <=1.005 (A) 1.010 - 1.025   Blood, UA neg    pH, UA 6.0 5.0 - 8.0   Protein, UA Negative Negative   Urobilinogen, UA 0.2 0.2 or 1.0 E.U./dL   Nitrite, UA neg    Leukocytes, UA Negative Negative   Appearance     Odor       Assessment and Plan:   Dysuria No red flags, she appears to be in no acute distress at this time UA normal Suspect that she was dealing with dehydration and constipation which is resolving Push fluids and continue to work on regular bowel movements Await urine culture for additional treatment, or if new sx develop, I have asked her to let us know  Acute right-sided low back pain without sciatica No red flags Suspect muscle strain Start oral diclofenac 75 mg BID (she already has this at home) UA normal, however did state that I cannot r/o possibility of kidney stone with her symptoms -- discussed typical presentation of this  If new/worsening symptoms or no improvement in a few days, I have asked her to reach out to [place]   I,Havlyn C Ratchford,acting as a scribe for [employer], PA.,have documented all relevant documentation on the behalf of Inda Coke, PA,as directed by  Inda Coke, PA while in the presence of Inda Coke, [place].  I, Inda Coke, [place], have reviewed all documentation for this visit. The documentation on 07/28/21 for the exam, diagnosis, procedures, and orders are all accurate and complete.  Inda Coke, PA-C

## 2021-07-29 ENCOUNTER — Encounter: Payer: Self-pay | Admitting: Physician Assistant

## 2021-07-29 LAB — URINE CULTURE
MICRO NUMBER:: 12663749
SPECIMEN QUALITY:: ADEQUATE

## 2021-07-30 ENCOUNTER — Other Ambulatory Visit: Payer: Self-pay | Admitting: Physician Assistant

## 2021-07-30 MED ORDER — CYCLOBENZAPRINE HCL 10 MG PO TABS: 10.0000 mg | ORAL_TABLET | Freq: Three times a day (TID) | ORAL | 0 refills | Status: DC | PRN

## 2021-08-18 DIAGNOSIS — L570 Actinic keratosis: Secondary | ICD-10-CM | POA: Diagnosis not present

## 2021-08-18 DIAGNOSIS — Z23 Encounter for immunization: Secondary | ICD-10-CM | POA: Diagnosis not present

## 2021-09-19 ENCOUNTER — Telehealth: Payer: Self-pay

## 2021-09-19 NOTE — Telephone Encounter (Signed)
Can use a same day if needed.

## 2021-09-19 NOTE — Telephone Encounter (Signed)
Patient had to reschedule CPE from Feb to May due to provider being out of the office.  Patient is requesting to get worked in for a BP check and thyroid check in February.    Please advise.

## 2021-09-22 NOTE — Telephone Encounter (Signed)
Patient scheduled.

## 2021-10-24 ENCOUNTER — Ambulatory Visit: Payer: BC Managed Care – PPO | Admitting: Family Medicine

## 2021-10-24 ENCOUNTER — Encounter: Payer: Self-pay | Admitting: Family Medicine

## 2021-10-24 ENCOUNTER — Other Ambulatory Visit: Payer: Self-pay

## 2021-10-24 VITALS — BP 130/84 | HR 71 | Temp 98.0°F | Ht 65.0 in | Wt 245.0 lb

## 2021-10-24 DIAGNOSIS — E669 Obesity, unspecified: Secondary | ICD-10-CM | POA: Diagnosis not present

## 2021-10-24 DIAGNOSIS — K760 Fatty (change of) liver, not elsewhere classified: Secondary | ICD-10-CM

## 2021-10-24 DIAGNOSIS — F5102 Adjustment insomnia: Secondary | ICD-10-CM

## 2021-10-24 DIAGNOSIS — I10 Essential (primary) hypertension: Secondary | ICD-10-CM

## 2021-10-24 DIAGNOSIS — E890 Postprocedural hypothyroidism: Secondary | ICD-10-CM | POA: Diagnosis not present

## 2021-10-24 LAB — LIPID PANEL
Cholesterol: 207 mg/dL — ABNORMAL HIGH (ref 0–200)
HDL: 56.7 mg/dL (ref >39.00)
LDL Cholesterol: 126 mg/dL — ABNORMAL HIGH (ref 0–99)
NonHDL: 149.84
Total CHOL/HDL Ratio: 4
Triglycerides: 118 mg/dL (ref 0.0–149.0)
VLDL: 23.6 mg/dL (ref 0.0–40.0)

## 2021-10-24 LAB — CBC WITH DIFFERENTIAL/PLATELET
Basophils Absolute: 0.1 10*3/uL (ref 0.0–0.1)
Basophils Relative: 0.8 % (ref 0.0–3.0)
Eosinophils Absolute: 0.2 10*3/uL (ref 0.0–0.7)
Eosinophils Relative: 2.5 % (ref 0.0–5.0)
HCT: 40 % (ref 36.0–46.0)
Hemoglobin: 13.7 g/dL (ref 12.0–15.0)
Lymphocytes Relative: 22.3 % (ref 12.0–46.0)
Lymphs Abs: 1.6 10*3/uL (ref 0.7–4.0)
MCHC: 34.4 g/dL (ref 30.0–36.0)
MCV: 88 fl (ref 78.0–100.0)
Monocytes Absolute: 0.4 10*3/uL (ref 0.1–1.0)
Monocytes Relative: 6 % (ref 3.0–12.0)
Neutro Abs: 4.9 10*3/uL (ref 1.4–7.7)
Neutrophils Relative %: 68.4 % (ref 43.0–77.0)
Platelets: 294 10*3/uL (ref 150.0–400.0)
RBC: 4.54 Mil/uL (ref 3.87–5.11)
RDW: 13.5 % (ref 11.5–15.5)
WBC: 7.1 10*3/uL (ref 4.0–10.5)

## 2021-10-24 LAB — COMPREHENSIVE METABOLIC PANEL
ALT: 32 U/L (ref 0–35)
AST: 21 U/L (ref 0–37)
Albumin: 4.5 g/dL (ref 3.5–5.2)
Alkaline Phosphatase: 66 U/L (ref 39–117)
BUN: 14 mg/dL (ref 6–23)
CO2: 30 mEq/L (ref 19–32)
Calcium: 10.2 mg/dL (ref 8.4–10.5)
Chloride: 100 mEq/L (ref 96–112)
Creatinine, Ser: 0.72 mg/dL (ref 0.40–1.20)
GFR: 96.13 mL/min (ref >60.00)
Glucose, Bld: 92 mg/dL (ref 70–99)
Potassium: 4.2 mEq/L (ref 3.5–5.1)
Sodium: 136 mEq/L (ref 135–145)
Total Bilirubin: 0.4 mg/dL (ref 0.2–1.2)
Total Protein: 6.7 g/dL (ref 6.0–8.3)

## 2021-10-24 LAB — HEMOGLOBIN A1C: Hgb A1c MFr Bld: 5.8 % (ref 4.6–6.5)

## 2021-10-24 LAB — TSH: TSH: 1.03 u[IU]/mL (ref 0.35–5.50)

## 2021-10-24 MED ORDER — HYDROCHLOROTHIAZIDE 12.5 MG PO CAPS: 12.5000 mg | ORAL_CAPSULE | Freq: Every day | ORAL | 3 refills | Status: DC

## 2021-10-24 MED ORDER — LISINOPRIL 40 MG PO TABS: 40.0000 mg | ORAL_TABLET | Freq: Every day | ORAL | 3 refills | Status: DC

## 2021-10-24 NOTE — Patient Instructions (Signed)
Please return in 3 months for your physical and pap smear.   I will release your lab results to you on your MyChart account with further instructions. You may see the results before I do, but when I review them I will send you a message with my report or have my assistant call you if things need to be discussed. Please reply to my message with any questions. Thank you!   If you have any questions or concerns, please don't hesitate to send me a message via MyChart or call the office at [phone]. Thank you for visiting with [place] today! It's our pleasure caring for you.

## 2021-10-24 NOTE — Progress Notes (Signed)
Subjective  CC:  Chief Complaint  Patient presents with   Hypertension   Hypothyroidism    HPI: Angelica Lester is a 51 y.o. female who presents to the office today to address the problems listed above in the chief complaint. Hypertension f/u: Control is fair . Pt reports she is doing well. taking medications as instructed, no medication side effects noted, no TIAs, no chest pain on exertion, no dyspnea on exertion, no swelling of ankles. She denies adverse effects from his BP medications. Compliance with medication is good.  Postoperative hypohtyroidism: non cancerous. Has been very stable on syntrhoid 172mcg daily. Has seen endocrine: ready to let me strt managing. No sxs of high or low thyroid. Compliant Obesity: has been logging food; avg 2000kcal per day. High protein. Some fast food. No exercise. Very busy work life: tired after work.  Fatty liver.  Ambien for sleep; works well.  Assessment  1. Essential hypertension   2. Postoperative hypothyroidism   3. Hepatic steatosis   4. Obesity (BMI 30-39.9)   5. Adjustment insomnia      Plan   Hypertension f/u: BP control is fairly well controlled. Work on decreasing salt in diet and weight loss. Recheck 3 months. May need to add amlodipine.  Hypothyroidism: recheck today. Clinically euthyorid Long discussion regarding diet, decreasing calories, healthy food choices and exercise. Would benefit from glp1 if covered by insurance Continue ambien and recheck liver.   Education regarding management of these chronic disease states was given. Management strategies discussed on successive visits include dietary and exercise recommendations, goals of achieving and maintaining IBW, and lifestyle modifications aiming for adequate sleep and minimizing stressors.   Follow up: Return in about 3 months (around 01/21/2022) for complete physical with pap smear.  Orders Placed This Encounter  Procedures   CBC with Differential/Platelet    Comprehensive metabolic panel   Lipid panel   Hemoglobin A1c   TSH   No orders of the defined types were placed in this encounter.     BP Readings from Last 3 Encounters:  10/24/21 130/84  07/28/21 122/90  05/20/21 124/86   Wt Readings from Last 3 Encounters:  10/24/21 245 lb (111.1 kg)  07/28/21 247 lb 4 oz (112.2 kg)  05/20/21 244 lb 3.2 oz (110.8 kg)    Lab Results  Component Value Date   CHOL 203 (H) 10/17/2020   CHOL 209 (H) 04/05/2019   CHOL 194 03/29/2018   Lab Results  Component Value Date   HDL 51.40 10/17/2020   HDL 57.40 04/05/2019   HDL 56.50 03/29/2018   Lab Results  Component Value Date   LDLCALC 126 (H) 10/17/2020   LDLCALC 112 (H) 04/05/2019   LDLCALC 111 (H) 03/29/2018   Lab Results  Component Value Date   TRIG 127.0 10/17/2020   TRIG 198.0 (H) 04/05/2019   TRIG 131.0 03/29/2018   Lab Results  Component Value Date   CHOLHDL 4 10/17/2020   CHOLHDL 4 04/05/2019   CHOLHDL 3 03/29/2018   Lab Results  Component Value Date   LDLDIRECT 137.0 10/01/2017   Lab Results  Component Value Date   CREATININE 0.77 12/16/2020   BUN 10 12/16/2020   NA 134 (L) 12/16/2020   K 3.9 12/16/2020   CL 96 12/16/2020   CO2 27 12/16/2020    The 10-year ASCVD risk score (Arnett DK, et al., 2019) is: 2.3%   Values used to calculate the score:     Age: 18 years  Sex: Female     Is Non-Hispanic African American: No     Diabetic: No     Tobacco smoker: No     Systolic Blood Pressure: 161 mmHg     Is BP treated: Yes     HDL Cholesterol: 51.4 mg/dL     Total Cholesterol: 203 mg/dL  I reviewed the patients updated PMH, FH, and SocHx.    Patient Active Problem List   Diagnosis Date Noted   Essential hypertension 03/12/2017    Priority: High   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    Priority: High   Obesity (BMI 30-39.9) 03/09/2016    Priority: High   Family history of colon cancer 09/27/2013    Priority: High   Hepatic steatosis 10/23/2020     Priority: Medium    GERD (gastroesophageal reflux disease) 09/27/2013    Priority: Medium    Irritable bowel syndrome 08/28/2010    Priority: Medium    Migraine headache 02/24/2010    Priority: Medium    Perimenopausal vasomotor symptoms 03/12/2017    Priority: Low   Seasonal allergies 01/15/2014    Priority: Low   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 She has never used smokeless tobacco. She reports current alcohol use of about 3.0 standard drinks per week. She reports that she does not use drugs.  Current Meds  Medication Sig   albuterol (VENTOLIN HFA) 108 (90 Base) MCG/ACT inhaler Inhale 2 puffs into the lungs every 6 (six) hours as needed for wheezing or shortness of breath.   ALPRAZolam (XANAX) 0.5 MG tablet TAKE ONE TABLET BY MOUTH DAILY AS NEEDED FOR ANXIETY   cyclobenzaprine (FLEXERIL) 10 MG tablet Take 1 tablet (10 mg total) by mouth 3 (three) times daily as needed for muscle spasms.   diclofenac (VOLTAREN) 75 MG EC tablet Take 1 tablet (75 mg total) by mouth 2 (two) times daily.   dicyclomine (BENTYL) 10 MG capsule Take 1 capsule (10 mg total) by mouth 3 (three) times daily before meals. As needed   fluticasone (FLONASE) 50 MCG/ACT nasal spray USE 1 SPRAY INTO BOTH NOSTRILS DAILY   hydrochlorothiazide (MICROZIDE) 12.5 MG capsule Take 1 capsule (12.5 mg total) by mouth daily.   ibuprofen (ADVIL,MOTRIN) 200 MG tablet Take 400 mg by mouth as needed for headache, mild pain or moderate pain.    lisinopril (ZESTRIL) 40 MG tablet Take 1 tablet (40 mg total) by mouth daily.   montelukast (SINGULAIR) 10 MG tablet Take 1 tablet (10 mg total) by mouth daily.   ondansetron (ZOFRAN) 4 MG tablet Take 1 tablet (4 mg total) by mouth every 8 (eight) hours as needed for nausea or vomiting.    SYNTHROID 150 MCG tablet Take 150 mcg by mouth daily.    Review of Systems: Cardiovascular: negative for chest pain, palpitations, leg swelling, orthopnea Respiratory: negative for SOB, wheezing or persistent cough Gastrointestinal: negative for abdominal pain Genitourinary: negative for dysuria or gross hematuria  Objective  Vitals: BP 130/84    Pulse 71    Temp 98 F (36.7 C) (Temporal)    Ht 5\' 5"  (1.651 m)    Wt 245 lb (111.1 kg)    LMP 06/04/2019    SpO2 96%    BMI 40.77 kg/m  General: no acute distress  Psych:  Alert and  oriented, normal mood and affect HEENT:  Normocephalic, atraumatic, supple neck  Cardiovascular:  RRR without murmur. no edema Neurologic:   Mental status is normal Commons side effects, risks, benefits, and alternatives for medications and treatment plan prescribed today were discussed, and the patient expressed understanding of the given instructions. Patient is instructed to call or message via MyChart if he/she has any questions or concerns regarding our treatment plan. No barriers to understanding were identified. We discussed Red Flag symptoms and signs in detail. Patient expressed understanding regarding what to do in case of urgent or emergency type symptoms.  Medication list was reconciled, printed and provided to the patient in AVS. Patient instructions and summary information was reviewed with the patient as documented in the AVS. This note was prepared with assistance of Dragon voice recognition software. Occasional wrong-word or sound-a-like substitutions may have occurred due to the inherent limitations of voice recognition software  This visit occurred during the SARS-CoV-2 public health emergency.  Safety protocols were in place, including screening questions prior to the visit, additional usage of staff PPE, and extensive cleaning of exam room while observing appropriate contact time as indicated for disinfecting solutions.

## 2021-10-31 ENCOUNTER — Encounter: Payer: BC Managed Care – PPO | Admitting: Family Medicine

## 2021-11-03 ENCOUNTER — Encounter: Payer: Self-pay | Admitting: Family Medicine

## 2021-11-03 ENCOUNTER — Other Ambulatory Visit: Payer: Self-pay | Admitting: Family Medicine

## 2021-11-04 MED ORDER — SYNTHROID 150 MCG PO TABS: 150.0000 ug | ORAL_TABLET | Freq: Every day | ORAL | 1 refills | Status: DC

## 2021-11-05 MED ORDER — ALPRAZOLAM 0.5 MG PO TABS: ORAL_TABLET | ORAL | 2 refills | Status: DC

## 2021-12-11 DIAGNOSIS — H0014 Chalazion left upper eyelid: Secondary | ICD-10-CM | POA: Diagnosis not present

## 2022-01-07 ENCOUNTER — Encounter: Payer: Self-pay | Admitting: Family Medicine

## 2022-01-07 ENCOUNTER — Ambulatory Visit (INDEPENDENT_AMBULATORY_CARE_PROVIDER_SITE_OTHER): Payer: BC Managed Care – PPO | Admitting: Family Medicine

## 2022-01-07 ENCOUNTER — Other Ambulatory Visit (HOSPITAL_COMMUNITY)
Admission: RE | Admit: 2022-01-07 | Discharge: 2022-01-07 | Disposition: A | Discharge status: Home / Self Care | Payer: BC Managed Care – PPO | Source: Ambulatory Visit | Attending: Family Medicine | Admitting: Family Medicine

## 2022-01-07 VITALS — BP 126/86 | HR 80 | Temp 98.0°F | Ht 65.0 in | Wt 238.4 lb

## 2022-01-07 DIAGNOSIS — Z124 Encounter for screening for malignant neoplasm of cervix: Secondary | ICD-10-CM

## 2022-01-07 DIAGNOSIS — R87610 Atypical squamous cells of undetermined significance on cytologic smear of cervix (ASC-US): Secondary | ICD-10-CM | POA: Insufficient documentation

## 2022-01-07 DIAGNOSIS — E669 Obesity, unspecified: Secondary | ICD-10-CM

## 2022-01-07 DIAGNOSIS — I10 Essential (primary) hypertension: Secondary | ICD-10-CM | POA: Diagnosis not present

## 2022-01-07 DIAGNOSIS — Z0000 Encounter for general adult medical examination without abnormal findings: Secondary | ICD-10-CM

## 2022-01-07 DIAGNOSIS — E890 Postprocedural hypothyroidism: Secondary | ICD-10-CM

## 2022-01-07 DIAGNOSIS — F5102 Adjustment insomnia: Secondary | ICD-10-CM

## 2022-01-07 DIAGNOSIS — K760 Fatty (change of) liver, not elsewhere classified: Secondary | ICD-10-CM | POA: Diagnosis not present

## 2022-01-07 MED ORDER — SYNTHROID 150 MCG PO TABS: 150.0000 ug | ORAL_TABLET | Freq: Every day | ORAL | 3 refills | Status: DC

## 2022-01-07 MED ORDER — DICYCLOMINE HCL 10 MG PO CAPS: 10.0000 mg | ORAL_CAPSULE | Freq: Three times a day (TID) | ORAL | 1 refills | Status: AC

## 2022-01-07 NOTE — Progress Notes (Signed)
?Subjective  ?Chief Complaint  ?Patient presents with  ? Annual Exam  ?  Pt here for Annual Exam and Pap and is currently fasting  ? ? ?HPI: Angelica Lester is a 51 y.o. female who presents to [facility] today for a Female Wellness Visit. She also has the concerns and/or needs as listed above in the chief complaint. These will be addressed in addition to the Health Maintenance Visit.  ? ?Wellness Visit: annual visit with health maintenance review and exam with Pap ? ?Health maintenance: Mammogram is scheduled for May.  Colonoscopy is up-to-date.  Repeat Pap smear this year, last year showed ASCUS with negative high-risk HPV. ?Chronic disease f/u and/or acute problem visit: (deemed necessary to be done in addition to the wellness visit): ?Obesity: Losing weight.  Is working hard on diet.  Eating much healthier. ?Hypertension: Blood pressures are coming down.  Home log shows average between 110-120s over 70s to 80s at this point.  Continues on lisinopril 20 mg and HCTZ 12.5 daily.  Feels well no chest pain.  Eating low-sodium.  Eating well. ?Hypothyroidism: On 150 mcg daily of Synthroid.  Compliance is good.  Clinically stable. ?Postmenopausal symptoms are mild ?History of fatty liver.  No right upper quadrant pain eating low-fat ? ?Assessment  ?1. Annual physical exam   ?2. Cervical cancer screening   ?3. Essential hypertension   ?4. Postoperative hypothyroidism   ?5. Hepatic steatosis   ?6. ASCUS of cervix with negative high risk HPV   ?7. Obesity (BMI 30-39.9)   ?8. Adjustment insomnia   ? ?  ?Plan  ?Female Wellness Visit: ?Age appropriate Health Maintenance and Prevention measures were discussed with patient. Included topics are cancer screening recommendations, ways to keep healthy (see AVS) including dietary and exercise recommendations, regular eye and dental care, use of seat belts, and avoidance of moderate alcohol use and tobacco use.  Repeat Pap smear today due to ASCUS ?BMI:  discussed patient's BMI and encouraged positive lifestyle modifications to help get to or maintain a target BMI. ?HM needs and immunizations were addressed and ordered. See below for orders. See HM and immunization section for updates. ?Routine labs and screening tests ordered including cmp, cbc and lipids where appropriate. ?Discussed recommendations regarding Vit D and calcium supplementation (see AVS) ? ?Chronic disease management visit and/or acute problem visit: ?Obesity: Praised for weight loss.  Continue healthy diet changes.  Counseling done ?Hypertension is now well controlled.  Continue lisinopril 40 mg daily and Microzide 12.5 mg daily.  Renal function potassium and electrolytes are stable ?Hypothyroidism: Clinically euthyroid.  Refill medications today, Synthroid 150 mcg daily. ?Continue low-fat diet.  Continue stress reduction.  As needed sleep meds ? ?Follow up: 6 months to recheck weight and blood pressure ?No orders of the defined types were placed in this encounter. ? ?Meds ordered this encounter  ?Medications  ? SYNTHROID 150 MCG tablet  ?  Sig: Take 1 tablet (150 mcg total) by mouth daily.  ?  Dispense:  90 tablet  ?  Refill:  3  ? dicyclomine (BENTYL) 10 MG capsule  ?  Sig: Take 1 capsule (10 mg total) by mouth 3 (three) times daily before meals. As needed  ?  Dispense:  90 capsule  ?  Refill:  1  ? ?  ? ?Body mass index is 39.67 kg/m?. ?Wt Readings from Last 3 Encounters:  ?01/07/22 238 lb 6.4 oz (108.1 kg)  ?10/24/21 245 lb (111.1 kg)  ?07/28/21  247 lb 4 oz (112.2 kg)  ? ? ? ?Patient Active Problem List  ? Diagnosis Date Noted  ? Essential hypertension 03/12/2017  ?  Priority: High  ? 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  ?  Priority: High  ?  Overview:  ?Total thyroidectomy 10/2016 for multiple nodules and goiter ? ?  ? Obesity (BMI 30-39.9) 03/09/2016  ?  Priority: High  ? Family history of colon cancer 09/27/2013  ?  Priority: High  ? Hepatic steatosis 10/23/2020  ?  Priority: Medium   ?   With elevated liver tests: by ultrasound 10/2020 ? ?  ? GERD (gastroesophageal reflux disease) 09/27/2013  ?  Priority: Medium   ? Irritable bowel syndrome 08/28/2010  ?  Priority: Medium   ?  Overview:  ?Constipation predominant ? ?  ? Migraine headache 02/24/2010  ?  Priority: Medium   ? Perimenopausal vasomotor symptoms 03/12/2017  ?  Priority: Low  ? Seasonal allergies 01/15/2014  ?  Priority: Low  ? 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  Priority: Low  ? ASCUS of cervix with negative high risk HPV 10/23/2020  ?  Feb 2022: repeat feb 2023 ? ?  ? Adjustment insomnia 01/05/2019  ? ?Health Maintenance  ?Topic Date Due  ? PAP SMEAR-Modifier  10/17/2021  ? COVID-19 Vaccine (4 - Booster) 01/23/2022 (Originally 11/08/2020)  ? MAMMOGRAM  02/27/2022  ? INFLUENZA VACCINE  04/07/2022  ? TETANUS/TDAP  05/18/2029  ? COLONOSCOPY (Pts 45-98yr Insurance coverage will need to be confirmed)  06/27/2029  ? Hepatitis C Screening  Completed  ? HIV Screening  Completed  ? Zoster Vaccines- Shingrix  Completed  ? HPV VACCINES  Aged Out  ? ?Immunization History  ?Administered Date(s) Administered  ? Influenza Split 06/25/2011  ? Influenza, Seasonal, Injecte, Preservative Fre 07/09/2015  ? Influenza,inj,Quad PF,6+ Mos 06/21/2014, 05/19/2019, 06/12/2019  ? Influenza-Unspecified 06/07/2017, 09/13/2020  ? PFIZER(Purple Top)SARS-COV-2 Vaccination 11/22/2019, 12/06/2019, 09/13/2020  ? Tdap 09/08/2003, 03/08/2012, 05/19/2019  ? Zoster Recombinat (Shingrix) 06/12/2019, 09/27/2019  ? ?We updated and reviewed the patient's past history in detail and it is documented below. ?Allergies: ?Patient is allergic to fexofenadine and amoxicillin. 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 ?Past Surgical History ?Patient  has a past surgical history that includes Total thyroidectomy (10/19/2016); Mohs surgery (Right); Dilation and curettage of uterus (X 1); Thyroidectomy (N/A, 10/19/2016); Laparoscopic cholecystectomy (2005); and Breast surgery (12/14/2017). ?Family History: ?Patient family history includes Alzheimer's disease in her paternal grandmother; Atrial fibrillation in her father and mother; COPD in her mother; Colon cancer in her paternal grandfather; Heart disease in her sister; Hyperlipidemia in her mother; Hypertension in her mother; Liver disease in her sister. ?Social History:  ?Patient  reports that she has never smoked. She has never used smokeless tobacco. She reports current alcohol use of about 3.0 standard drinks per week. She reports that she does not use drugs. ? ?Review of Systems: ?Constitutional: negative for fever or malaise ?Ophthalmic: negative for photophobia, double vision or loss of vision ?Cardiovascular: negative for chest pain, dyspnea on exertion, or new LE swelling ?Respiratory: negative for SOB or persistent cough ?Gastrointestinal: negative for abdominal pain, change in bowel habits or melena ?Genitourinary: negative for dysuria or gross hematuria, no abnormal uterine bleeding or disharge ?Musculoskeletal: negative for new gait disturbance or muscular weakness ?Integumentary: negative  for new or persistent rashes, no breast lumps ?Neurological: negative for TIA or stroke symptoms ?Psychiatric: negative for SI or delusions ?Allergic/Immunologic: negative for hives ? ?Patient Care Team  ?  Relationship Specialty Notifications Start End  ?Leamon Arnt, MD PCP - General Family Medicine  10/12/16   ?Madelin Rear, MD Consulting Physician Endocrinology  10/01/17   ?Jari Pigg, MD Consulting Physician Dermatology  10/17/20   ? ? ?Objective  ?Vitals: BP 126/86   Pulse 80   Temp 98 ?F (36.7 ?C)   Ht '5\' 5"'$  (1.651 m)   Wt 238 lb 6.4 oz (108.1 kg)    LMP 06/04/2019   SpO2 98%   BMI 39.67 kg/m?  ?General:  Well developed, well nourished, no acute distress  ?Psych:  Alert and orientedx3,normal mood and affect ?HEENT:  Normocephalic, atraumatic, non-icteric

## 2022-01-07 NOTE — Patient Instructions (Signed)
Please return in 6 months for hypertension follow up.  ? ?I'll send you a note with your pap smear results.  ? ?BRAVO!! I'm thrilled with your dietary changes.  ?Wt Readings from Last 3 Encounters:  ?01/07/22 238 lb 6.4 oz (108.1 kg)  ?10/24/21 245 lb (111.1 kg)  ?07/28/21 247 lb 4 oz (112.2 kg)  ? ? ?If you have any questions or concerns, please don't hesitate to send me a message via MyChart or call the office at [phone]. Thank you for visiting with [place] today! It's our pleasure caring for you.  ?

## 2022-01-09 LAB — CYTOLOGY - PAP
Comment: NEGATIVE
Diagnosis: NEGATIVE
High risk HPV: NEGATIVE

## 2022-02-10 ENCOUNTER — Encounter: Payer: Self-pay | Admitting: Family Medicine

## 2022-02-11 ENCOUNTER — Other Ambulatory Visit: Payer: Self-pay

## 2022-02-11 MED ORDER — MONTELUKAST SODIUM 10 MG PO TABS: 10.0000 mg | ORAL_TABLET | Freq: Every day | ORAL | 3 refills | Status: DC

## 2022-02-24 DIAGNOSIS — H02831 Dermatochalasis of right upper eyelid: Secondary | ICD-10-CM | POA: Diagnosis not present

## 2022-02-25 DIAGNOSIS — D225 Melanocytic nevi of trunk: Secondary | ICD-10-CM | POA: Diagnosis not present

## 2022-02-25 DIAGNOSIS — L570 Actinic keratosis: Secondary | ICD-10-CM | POA: Diagnosis not present

## 2022-02-25 DIAGNOSIS — D485 Neoplasm of uncertain behavior of skin: Secondary | ICD-10-CM | POA: Diagnosis not present

## 2022-02-25 DIAGNOSIS — L821 Other seborrheic keratosis: Secondary | ICD-10-CM | POA: Diagnosis not present

## 2022-02-25 DIAGNOSIS — D2262 Melanocytic nevi of left upper limb, including shoulder: Secondary | ICD-10-CM | POA: Diagnosis not present

## 2022-02-25 DIAGNOSIS — L578 Other skin changes due to chronic exposure to nonionizing radiation: Secondary | ICD-10-CM | POA: Diagnosis not present

## 2022-03-05 ENCOUNTER — Encounter: Payer: Self-pay | Admitting: Family Medicine

## 2022-03-05 DIAGNOSIS — Z1231 Encounter for screening mammogram for malignant neoplasm of breast: Secondary | ICD-10-CM | POA: Diagnosis not present

## 2022-03-23 ENCOUNTER — Encounter: Payer: Self-pay | Admitting: Family Medicine

## 2022-03-23 MED ORDER — LEVOTHYROXINE SODIUM 150 MCG PO TABS: 150.0000 ug | ORAL_TABLET | Freq: Every day | ORAL | 3 refills | Status: DC

## 2022-06-01 ENCOUNTER — Encounter: Payer: Self-pay | Admitting: *Deleted

## 2022-06-08 ENCOUNTER — Encounter: Payer: Self-pay | Admitting: Family Medicine

## 2022-06-08 ENCOUNTER — Telehealth (INDEPENDENT_AMBULATORY_CARE_PROVIDER_SITE_OTHER): Payer: BC Managed Care – PPO | Admitting: Family Medicine

## 2022-06-08 DIAGNOSIS — J40 Bronchitis, not specified as acute or chronic: Secondary | ICD-10-CM

## 2022-06-08 DIAGNOSIS — U071 COVID-19: Secondary | ICD-10-CM

## 2022-06-08 MED ORDER — AZITHROMYCIN 250 MG PO TABS: ORAL_TABLET | ORAL | 0 refills | Status: DC

## 2022-06-08 NOTE — Progress Notes (Signed)
Subjective:    Patient ID: Angelica Lester, female    DOB: [birth year], 51 y.o.   MRN: [id number]  HPI Virtual Visit via Video Note  I connected with the patient on 06/08/22 at  3:15 PM EDT by a video enabled telemedicine application and verified that I am speaking with the correct person using two identifiers.  Location patient: home Location provider:work or home office Persons participating in the virtual visit: patient, provider  I discussed the limitations of evaluation and management by telemedicine and the availability of in person appointments. The patient expressed understanding and agreed to proceed.   HPI: Here for prolonged symptoms after a Covid-19 infection. After going on several plane flights last week, she began to feel tired and developed a fever. Then she had body aches, headache, and a dry cough. No chest pain or SOB. She tested positive for the Covid virus 5 days ago. She has been taking Theraflu and Delsym. Now for the past 2 days the fever and body aches are gone, but the cough is now producing yellow sputum.    ROS: See pertinent positives and negatives per HPI.  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 Wilson Singer, endocrine   PONV (postoperative nausea and vomiting)    Skin cancer    "right arm" (10/19/2016)    Past Surgical History:  Procedure Laterality Date   BREAST SURGERY  12/14/2017   Reduction   DILATION AND CURETTAGE OF UTERUS  X 1   LAPAROSCOPIC CHOLECYSTECTOMY  2005   MOHS SURGERY Right    arm   THYROIDECTOMY N/A 10/19/2016   Procedure: TOTAL THYROIDECTOMY;  Surgeon: Armandina Gemma, MD;  Location: ;  Service: General;  Laterality: N/A;   TOTAL  THYROIDECTOMY  10/19/2016    Family History  Problem Relation Age of Onset   Atrial fibrillation Mother    COPD Mother    Hyperlipidemia Mother    Hypertension Mother    Atrial fibrillation Father    Heart disease Sister    Alzheimer's disease Paternal Grandmother    Colon cancer Paternal Grandfather    Liver disease Sister        Liver Transplant     Current Outpatient Medications:    albuterol (VENTOLIN HFA) 108 (90 Base) MCG/ACT inhaler, Inhale 2 puffs into the lungs every 6 (six) hours as needed for wheezing or shortness of breath., Disp: 1 each, Rfl: 2   ALPRAZolam (XANAX) 0.5 MG tablet, TAKE ONE TABLET BY MOUTH DAILY AS NEEDED FOR ANXIETY, Disp: 30 tablet, Rfl: 2   azithromycin (ZITHROMAX Z-PAK) 250 MG tablet, As directed, Disp: 6 each, Rfl: 0   cyclobenzaprine (FLEXERIL) 10 MG tablet, Take 1 tablet (10 mg total) by mouth 3 (three) times daily as needed for muscle spasms., Disp: 30 tablet, Rfl: 0   diclofenac (VOLTAREN) 75 MG EC tablet, Take 1 tablet (75 mg total) by mouth 2 (two) times daily., Disp: 30 tablet, Rfl: 0   dicyclomine (BENTYL) 10 MG capsule, Take 1 capsule (10 mg total) by mouth 3 (three) times daily before meals. As needed, Disp: 90 capsule, Rfl: 1   fluticasone (  FLONASE) 50 MCG/ACT nasal spray, USE 1 SPRAY INTO BOTH NOSTRILS DAILY, Disp: 16 g, Rfl: 5   hydrochlorothiazide (MICROZIDE) 12.5 MG capsule, Take 1 capsule (12.5 mg total) by mouth daily., Disp: 90 capsule, Rfl: 3   ibuprofen (ADVIL,MOTRIN) 200 MG tablet, Take 400 mg by mouth as needed for headache, mild pain or moderate pain. , Disp: , Rfl:    levothyroxine (SYNTHROID) 150 MCG tablet, Take 1 tablet (150 mcg total) by mouth daily., Disp: 90 tablet, Rfl: 3   lisinopril (ZESTRIL) 40 MG tablet, Take 1 tablet (40 mg total) by mouth daily., Disp: 90 tablet, Rfl: 3   montelukast (SINGULAIR) 10 MG tablet, Take 1 tablet (10 mg total) by mouth daily., Disp: 90 tablet, Rfl: 3  EXAM:  VITALS per patient if  applicable:  GENERAL: alert, oriented, appears well and in no acute distress  HEENT: atraumatic, conjunttiva clear, no obvious abnormalities on inspection of external nose and ears  NECK: normal movements of the head and neck  LUNGS: on inspection no signs of respiratory distress, breathing rate appears normal, no obvious gross SOB, gasping or wheezing  CV: no obvious cyanosis  MS: moves all visible extremities without noticeable abnormality  PSYCH/NEURO: pleasant and cooperative, no obvious depression or anxiety, speech and thought processing grossly intact  ASSESSMENT AND PLAN: She has had a Covid infection and she now has a secondary bronchitis. Treat with a Zpack. Recheck as needed.  Alysia Penna, MD  Discussed the following assessment and plan:  No diagnosis found.     I discussed the assessment and treatment plan with the patient. The patient was provided an opportunity to ask questions and all were answered. The patient agreed with the plan and demonstrated an understanding of the instructions.   The patient was advised to call back or seek an in-person evaluation if the symptoms worsen or if the condition fails to improve as anticipated.      Review of Systems     Objective:   Physical Exam        Assessment & Plan:

## 2022-06-29 ENCOUNTER — Encounter: Payer: Self-pay | Admitting: Family Medicine

## 2022-07-14 ENCOUNTER — Ambulatory Visit: Payer: BC Managed Care – PPO | Admitting: Family Medicine

## 2022-07-22 ENCOUNTER — Ambulatory Visit (INDEPENDENT_AMBULATORY_CARE_PROVIDER_SITE_OTHER): Payer: BC Managed Care – PPO | Admitting: Family Medicine

## 2022-07-22 VITALS — BP 116/80 | HR 72 | Temp 98.1°F | Ht 65.0 in | Wt 238.7 lb

## 2022-07-22 DIAGNOSIS — N950 Postmenopausal bleeding: Secondary | ICD-10-CM

## 2022-07-22 DIAGNOSIS — I10 Essential (primary) hypertension: Secondary | ICD-10-CM | POA: Diagnosis not present

## 2022-07-22 DIAGNOSIS — E669 Obesity, unspecified: Secondary | ICD-10-CM

## 2022-07-22 DIAGNOSIS — F5102 Adjustment insomnia: Secondary | ICD-10-CM

## 2022-07-22 DIAGNOSIS — B001 Herpesviral vesicular dermatitis: Secondary | ICD-10-CM

## 2022-07-22 DIAGNOSIS — Z23 Encounter for immunization: Secondary | ICD-10-CM

## 2022-07-22 MED ORDER — VALACYCLOVIR HCL 1 G PO TABS: 2000.0000 mg | ORAL_TABLET | Freq: Two times a day (BID) | ORAL | 2 refills | Status: DC

## 2022-07-22 MED ORDER — ALPRAZOLAM 0.5 MG PO TABS: 0.2500 mg | ORAL_TABLET | Freq: Every day | ORAL | 2 refills | Status: DC | PRN

## 2022-07-22 MED ORDER — DICLOFENAC SODIUM 75 MG PO TBEC: 75.0000 mg | DELAYED_RELEASE_TABLET | Freq: Two times a day (BID) | ORAL | 0 refills | Status: DC | PRN

## 2022-07-22 NOTE — Progress Notes (Signed)
Subjective  CC:  Chief Complaint  Patient presents with   Hypertension   Obesity    HPI: Angelica Lester is a 51 y.o. female who presents to the office today to address the problems listed above in the chief complaint. Hypertension f/u: Control is good . Pt reports she is doing well. taking medications as instructed, no medication side effects noted, no TIAs, no chest pain on exertion, no dyspnea on exertion, no swelling of ankles. She denies adverse effects from his BP medications. Compliance with medication is good.  Weight is stable.  Using xanax about twice weekly; mainly to alleviate work stress/anxiety. Declines need for daily medication. No other mood problems. Sleep is fair. Wants to get a new job but has to wait until September 2025 due to her contract.  Valtrex needed: getting a cold sore October had 3 days of moderate vaginal bleeding. No pain or discharge. Postmenopausal: lmp feb 2022.   Assessment  1. Essential hypertension   2. Obesity (BMI 30-39.9)   3. Postmenopausal bleeding   4. Adjustment insomnia   5. Need for immunization against influenza   6. Recurrent cold sores      Plan   Hypertension f/u: BP control is well controlled. Continue meds Working on weight loss TVUS to start eval for postmenopausal vaginal bleeding.  Xanax: prn use. Monitor and monitor mood.  Valtrex prn Flu shot today  Education regarding management of these chronic disease states was given. Management strategies discussed on successive visits include dietary and exercise recommendations, goals of achieving and maintaining IBW, and lifestyle modifications aiming for adequate sleep and minimizing stressors.   Follow up: No follow-ups on file.  Orders Placed This Encounter  Procedures   US PELVIC COMPLETE WITH TRANSVAGINAL   Flu Vaccine QUAD 61moIM (Fluarix, Fluzone & Alfiuria Quad PF)   Meds ordered this encounter  Medications   ALPRAZolam (XANAX) 0.5 MG tablet    Sig: Take 0.5-1  tablets (0.25-0.5 mg total) by mouth daily as needed for anxiety.    Dispense:  30 tablet    Refill:  2   diclofenac (VOLTAREN) 75 MG EC tablet    Sig: Take 1 tablet (75 mg total) by mouth 2 (two) times daily as needed.    Dispense:  60 tablet    Refill:  0   valACYclovir (VALTREX) 1000 MG tablet    Sig: Take 2 tablets (2,000 mg total) by mouth 2 (two) times daily. Once, as needed for cold sores    Dispense:  20 tablet    Refill:  2      BP Readings from Last 3 Encounters:  07/22/22 116/80  01/07/22 126/86  10/24/21 130/84   Wt Readings from Last 3 Encounters:  07/22/22 238 lb 11.2 oz (108.3 kg)  01/07/22 238 lb 6.4 oz (108.1 kg)  10/24/21 245 lb (111.1 kg)    Lab Results  Component Value Date   CHOL 207 (H) 10/24/2021   CHOL 203 (H) 10/17/2020   CHOL 209 (H) 04/05/2019   Lab Results  Component Value Date   HDL 56.70 10/24/2021   HDL 51.40 10/17/2020   HDL 57.40 04/05/2019   Lab Results  Component Value Date   LDLCALC 126 (H) 10/24/2021   LDLCALC 126 (H) 10/17/2020   LDLCALC 112 (H) 04/05/2019   Lab Results  Component Value Date   TRIG 118.0 10/24/2021   TRIG 127.0 10/17/2020   TRIG 198.0 (H) 04/05/2019   Lab Results  Component Value Date  CHOLHDL 4 10/24/2021   CHOLHDL 4 10/17/2020   CHOLHDL 4 04/05/2019   Lab Results  Component Value Date   LDLDIRECT 137.0 10/01/2017   Lab Results  Component Value Date   CREATININE 0.72 10/24/2021   BUN 14 10/24/2021   NA 136 10/24/2021   K 4.2 10/24/2021   CL 100 10/24/2021   CO2 30 10/24/2021    The 10-year ASCVD risk score (Arnett DK, et al., 2019) is: 1.8%   Values used to calculate the score:     Age: 39 years     Sex: Female     Is Non-Hispanic African American: No     Diabetic: No     Tobacco smoker: No     Systolic Blood Pressure: 166 mmHg     Is BP treated: Yes     HDL Cholesterol: 56.7 mg/dL     Total Cholesterol: 207 mg/dL  I reviewed the patients updated PMH, FH, and SocHx.    Patient  Active Problem List   Diagnosis Date Noted   Essential hypertension 03/12/2017    Priority: High   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    Priority: High   Obesity (BMI 30-39.9) 03/09/2016    Priority: High   Family history of colon cancer 09/27/2013    Priority: High   Hepatic steatosis 10/23/2020    Priority: Medium    Adjustment insomnia 01/05/2019    Priority: Medium    GERD (gastroesophageal reflux disease) 09/27/2013    Priority: Medium    Irritable bowel syndrome 08/28/2010    Priority: Medium    Migraine headache 02/24/2010    Priority: Medium    Perimenopausal vasomotor symptoms 03/12/2017    Priority: Low   Seasonal allergies 01/15/2014    Priority: Low   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 She has never used smokeless tobacco. She reports current alcohol use of about 3.0 standard drinks of alcohol per week. She reports that she does not use drugs.  Current Meds  Medication Sig   albuterol (VENTOLIN HFA) 108 (90 Base) MCG/ACT inhaler Inhale 2 puffs into the lungs every 6 (six) hours as needed for wheezing or shortness of breath.   dicyclomine (BENTYL) 10 MG capsule Take 1 capsule (10 mg total) by mouth 3 (three) times daily before meals. As needed   fluticasone (FLONASE) 50 MCG/ACT nasal spray USE 1 SPRAY INTO BOTH NOSTRILS DAILY   hydrochlorothiazide (MICROZIDE) 12.5 MG capsule Take 1 capsule (12.5 mg total) by mouth daily.   levothyroxine (SYNTHROID) 150 MCG tablet Take 1 tablet (150 mcg total) by mouth daily.   lisinopril (ZESTRIL) 40 MG tablet Take 1 tablet (40 mg total) by mouth daily.   montelukast (SINGULAIR) 10 MG tablet Take 1 tablet (10 mg total) by mouth daily.   valACYclovir (VALTREX) 1000 MG tablet Take 2 tablets (2,000 mg total) by mouth 2 (two)  times daily. Once, as needed for cold sores   [DISCONTINUED] ALPRAZolam (XANAX) 0.5 MG tablet TAKE ONE TABLET BY MOUTH DAILY AS NEEDED FOR ANXIETY   [DISCONTINUED] cyclobenzaprine (FLEXERIL) 10 MG tablet Take 1 tablet (10 mg total) by mouth 3 (three) times daily as needed for muscle spasms.   [DISCONTINUED] diclofenac (VOLTAREN) 75 MG EC tablet Take 1 tablet (75 mg total) by mouth 2 (two) times daily.   [  DISCONTINUED] ibuprofen (ADVIL,MOTRIN) 200 MG tablet Take 400 mg by mouth as needed for headache, mild pain or moderate pain.     Review of Systems: Cardiovascular: negative for chest pain, palpitations, leg swelling, orthopnea Respiratory: negative for SOB, wheezing or persistent cough Gastrointestinal: negative for abdominal pain Genitourinary: negative for dysuria or gross hematuria  Objective  Vitals: BP 116/80   Pulse 72   Temp 98.1 F (36.7 C)   Ht '5\' 5"'$  (1.651 m)   Wt 238 lb 11.2 oz (108.3 kg)   LMP 06/04/2019   SpO2 98%   BMI 39.72 kg/m  General: no acute distress  Psych:  Alert and oriented, normal mood and affect HEENT:  Normocephalic, atraumatic, supple neck  Cardiovascular:  RRR without murmur. no edema Respiratory:  Good breath sounds bilaterally, CTAB with normal respiratory effort Skin:  Warm, no rashes Neurologic:   Mental status is normal Commons side effects, risks, benefits, and alternatives for medications and treatment plan prescribed today were discussed, and the patient expressed understanding of the given instructions. Patient is instructed to call or message via MyChart if he/she has any questions or concerns regarding our treatment plan. No barriers to understanding were identified. We discussed Red Flag symptoms and signs in detail. Patient expressed understanding regarding what to do in case of urgent or emergency type symptoms.  Medication list was reconciled, printed and provided to the patient in AVS. Patient instructions and summary information was  reviewed with the patient as documented in the AVS. This note was prepared with assistance of Dragon voice recognition software. Occasional wrong-word or sound-a-like substitutions may have occurred due to the inherent limitation

## 2022-07-22 NOTE — Patient Instructions (Addendum)
Please return in 3 months for your annual complete physical; please come fasting. With pap smear  We will call you with an appointment for your pelvic ultrasound.  If you have any questions or concerns, please don't hesitate to send me a message via MyChart or call the office at [phone]. Thank you for visiting with [place] today! It's our pleasure caring for you.   Postmenopausal Bleeding Postmenopausal bleeding is any bleeding that a woman has after she has entered menopause. Menopause is the end of a woman's fertile years. After menopause, a woman no longer ovulates and does not have menstrual periods. Therefore, she should no longer have bleeding from her vagina. Postmenopausal bleeding may have various causes, including: Menopausal hormone therapy (MHT). Endometrial atrophy. After menopause, low estrogen hormone levels cause the membrane that lines the uterus (endometrium) to become thin. You may have bleeding as the endometrium thins. Endometrial hyperplasia. This condition is caused by excess estrogen hormones and low levels of progesterone hormones. The excess estrogen causes the endometrium to thicken, which can lead to bleeding. In some cases, this can lead to cancer of the uterus. Endometrial cancer. Noncancerous growths (polyps) on the endometrium, the lining of the uterus, or the cervix. Uterine fibroids. These are noncancerous growths in or around the uterus muscle tissue that can cause heavy bleeding. Any type of postmenopausal bleeding, even if it appears to be a typical menstrual period, should be checked by your health care provider. Treatment will depend on the cause of the bleeding. Follow these instructions at home:  Pay attention to any changes in your symptoms. Let your health care provider know about them. Avoid using tampons and douches as told by your health care provider. Change your pads regularly. Get regular pelvic exams, including Pap tests, as told by your health  care provider. Take iron supplements as told by your health care provider. Take over-the-counter and prescription medicines only as told by your health care provider. Keep all follow-up visits. This is important. Contact a health care provider if: You have new bleeding from the vagina after menopause. You have pain in your abdomen. Get help right away if: You have a fever or chills. You have severe pain with bleeding. You are passing blood clots. You have heavy bleeding, need more than 1 pad an hour, and have never experienced this before. You have headaches or feel faint or dizzy. Summary Postmenopausal bleeding is any bleeding that a woman has after she has entered into menopause. Postmenopausal bleeding may have various causes. Treatment will depend on the cause of the bleeding. Any type of postmenopausal bleeding, even if it appears to be a typical menstrual period, should be checked by your health care provider. Be sure to pay attention to any changes in your symptoms and keep all follow-up visits. This information is not intended to replace advice given to you by your health care provider. Make sure you discuss any questions you have with your health care provider. Document Revised: 02/08/2020 Document Reviewed: 02/08/2020 Elsevier Patient Education  [place].

## 2022-08-04 ENCOUNTER — Ambulatory Visit
Admission: RE | Admit: 2022-08-04 | Discharge: 2022-08-04 | Disposition: A | Discharge status: Home / Self Care | Payer: BC Managed Care – PPO | Source: Ambulatory Visit | Attending: Family Medicine | Admitting: Family Medicine

## 2022-08-04 DIAGNOSIS — N858 Other specified noninflammatory disorders of uterus: Secondary | ICD-10-CM | POA: Diagnosis not present

## 2022-08-04 DIAGNOSIS — N950 Postmenopausal bleeding: Secondary | ICD-10-CM | POA: Diagnosis not present

## 2022-08-05 ENCOUNTER — Other Ambulatory Visit: Payer: Self-pay

## 2022-08-05 DIAGNOSIS — N950 Postmenopausal bleeding: Secondary | ICD-10-CM

## 2022-08-05 DIAGNOSIS — R9389 Abnormal findings on diagnostic imaging of other specified body structures: Secondary | ICD-10-CM

## 2022-08-05 NOTE — Progress Notes (Signed)
Please call patient: I have reviewed his/her lab results. Her ultrasound results show that her uterine lining is thicker than it should be. Because of this, she needs to have an endometrial (uterine lining biopsy) to see if it is ok. Please let her know and refer to GYN for postmenopausal bleeding and thickened endometrium. Thanks.

## 2022-08-18 ENCOUNTER — Ambulatory Visit: Payer: Self-pay | Admitting: Nurse Practitioner

## 2022-08-25 ENCOUNTER — Ambulatory Visit: Payer: Self-pay | Admitting: Nurse Practitioner

## 2022-09-02 NOTE — Progress Notes (Signed)
51 y.o. No obstetric history on file. Divorced Caucasian female here for [place].  Pt had a period a year and a half (06/26/22) after her last one (10/2020). Pt had an external and internal U/S and received a referral here. [place] 46.6 on 12/16/20.   She feels she had a completely normal period in October, 2023.   Not taking any hormonal therapy.   Has had hot flashes, but they improved this summer.  Some hot flashes.   Now noticing more skin and hair dryness.   Hx irregular menses, but never skipped cycles.   Not sexually active since this summer.  Long term relationship ended. Has had HIV screening with PCP.   Study Result  Narrative & Impression  CLINICAL DATA:  Post menopausal bleeding   EXAM: TRANSABDOMINAL AND TRANSVAGINAL ULTRASOUND OF PELVIS   TECHNIQUE: Both transabdominal and transvaginal ultrasound examinations of the pelvis were performed. Transabdominal technique was performed for global imaging of the pelvis including uterus, ovaries, adnexal regions, and pelvic cul-de-sac. It was necessary to proceed with endovaginal exam following the transabdominal exam to visualize the uterus and adnexa.   COMPARISON:  Report 05/14/2022   FINDINGS: Uterus   Measurements: 7.2 x 3.2 x 4.3 cm = volume: 51 mL. No fibroids or other mass visualized.   Endometrium   Thickness: 6.7 mm.  Tiny cyst in the mid uterus measuring 4 mm.   Right ovary   Not seen.   Left ovary   Not seen   Other findings   No abnormal free fluid.   IMPRESSION: 1. Endometrial thickness of 6.7 mm. In the setting of post-menopausal bleeding, endometrial sampling is indicated to exclude carcinoma. If results are benign, sonohysterogram should be considered for focal lesion work-up. (Ref: Radiological Reasoning: Algorithmic Workup of Abnormal Vaginal Bleeding with Endovaginal Sonography and Sonohysterography. AJR 2008; [id number]) 2. Nonvisualized ovaries     Electronically Signed    By: Donavan Foil M.D.   On: 08/04/2022 18:07      PCP:   Billey Chang, MD  Patient's last menstrual period was 06/04/2019.           Sexually active: No.  The current method of family planning is post menopausal status.    Exercising: Yes.     Walking. Smoker:  no  Health Maintenance: Pap:  01/07/22 negative: HR HPV negative, 10/17/20 ASCUS, HR HPV negative History of abnormal Pap:  yes MMG:  03/05/22, Breast Density Category C, BI-RADS CATEGORY 2 Benign Colonoscopy:  n/a BMD:   n/a  Result  n/a TDaP:  05/19/19 Gardasil:   no HIV: donated blood Hep C: donated blood Screening Labs:  PCP   reports that she has never smoked. She has never used smokeless tobacco. She reports current alcohol use of about 3.0 standard drinks of alcohol per week. She reports that she does not use drugs.  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 Wilson Singer, endocrine   PONV (postoperative nausea and vomiting)    Skin cancer    "right arm" (10/19/2016)  Past Surgical History:  Procedure Laterality Date   BREAST SURGERY  12/14/2017   Reduction   DILATION AND CURETTAGE OF UTERUS  X 1   LAPAROSCOPIC CHOLECYSTECTOMY  2005   MOHS SURGERY Right    arm   THYROIDECTOMY N/A 10/19/2016   Procedure: TOTAL THYROIDECTOMY;  Surgeon: Armandina Gemma, MD;  Location: [facility];  Service: General;  Laterality: N/A;   TOTAL THYROIDECTOMY  10/19/2016    Current Outpatient Medications  Medication Sig Dispense Refill   albuterol (VENTOLIN HFA) 108 (90 Base) MCG/ACT inhaler Inhale 2 puffs into the lungs every 6 (six) hours as needed for wheezing or shortness of breath. 1  each 2   ALPRAZolam (XANAX) 0.5 MG tablet Take 0.5-1 tablets (0.25-0.5 mg total) by mouth daily as needed for anxiety. 30 tablet 2   diclofenac (VOLTAREN) 75 MG EC tablet Take 1 tablet (75 mg total) by mouth 2 (two) times daily as needed. 60 tablet 0   dicyclomine (BENTYL) 10 MG capsule Take 1 capsule (10 mg total) by mouth 3 (three) times daily before meals. As needed 90 capsule 1   fluticasone (FLONASE) 50 MCG/ACT nasal spray USE 1 SPRAY INTO BOTH NOSTRILS DAILY 16 g 5   hydrochlorothiazide (MICROZIDE) 12.5 MG capsule Take 1 capsule (12.5 mg total) by mouth daily. 90 capsule 3   levothyroxine (SYNTHROID) 150 MCG tablet Take 1 tablet (150 mcg total) by mouth daily. 90 tablet 3   lisinopril (ZESTRIL) 40 MG tablet Take 1 tablet (40 mg total) by mouth daily. 90 tablet 3   montelukast (SINGULAIR) 10 MG tablet Take 1 tablet (10 mg total) by mouth daily. 90 tablet 3   valACYclovir (VALTREX) 1000 MG tablet Take 2 tablets (2,000 mg total) by mouth 2 (two) times daily. Once, as needed for cold sores 20 tablet 2   No current facility-administered medications for this visit.    Family History  Problem Relation Age of Onset   Atrial fibrillation Mother    COPD Mother    Hyperlipidemia Mother    Hypertension Mother    Atrial fibrillation Father    Heart disease Sister    Alzheimer's disease Paternal Grandmother    Colon cancer Paternal Grandfather    Liver disease Sister        Liver Transplant    Review of Systems  All other systems reviewed and are negative.   Exam:   BP 120/78 (BP Location: Left Arm, Patient Position: Sitting, Cuff Size: Large)   Pulse 81   Ht 5' 4.5" (1.638 m)   Wt 234 lb (106.1 kg)   LMP 06/04/2019   SpO2 99%   BMI 39.55 kg/m     General appearance: alert, cooperative and appears stated age Head: normocephalic, without obvious abnormality, atraumatic Lungs: clear to auscultation bilaterally Heart: regular rate and rhythm Abdomen: soft, non-tender; no masses, no  organomegaly  Pelvic: External genitalia:  no lesions              No abnormal inguinal nodes palpated.              Urethra:  normal appearing urethra with no masses, tenderness or lesions              Bartholins and Skenes: normal                 Vagina: normal appearing vagina with normal color and discharge, no lesions              Cervix: no lesions  Pap taken: no Bimanual Exam:  Uterus:  normal size, contour, position, consistency, mobility, non-tender              Adnexa: no mass, fullness, tenderness              Rectal exam: yes.  Confirms.              Anus:  normal sphincter tone, no lesions  Chaperone was present for exam:  Emily  Assessment:   Postmenopausal bleeding.  Thickened endometrium on pelvic US.  Tiny cystic area noted.  Ovaries not seen.  STD screening.   Plan: [place] report and images reviewed.  We discussed postmenopausal bleeding and etiologies:  atrophy, polyp, infection, hyperplasia, and cancer. Will check FSH and estradiol.  Screening for gonorrhea, chlamydia, and trichomonas.  I anticipate she will return for an endometrial biopsy.  Procedure and rationale explained.  We reviewed that treatment for postmenopausal bleeding depends on the diagnosis:  observation, dilation and curettage, progesterone tx, hysterectomy.   After visit summary provided.

## 2022-09-14 ENCOUNTER — Ambulatory Visit: Payer: BC Managed Care – PPO | Admitting: Obstetrics and Gynecology

## 2022-09-14 ENCOUNTER — Encounter: Payer: Self-pay | Admitting: Obstetrics and Gynecology

## 2022-09-14 ENCOUNTER — Other Ambulatory Visit (HOSPITAL_COMMUNITY)
Admission: RE | Admit: 2022-09-14 | Discharge: 2022-09-14 | Disposition: A | Discharge status: Home / Self Care | Payer: BC Managed Care – PPO | Source: Ambulatory Visit | Attending: Nurse Practitioner | Admitting: Nurse Practitioner

## 2022-09-14 VITALS — BP 120/78 | HR 81 | Ht 64.5 in | Wt 234.0 lb

## 2022-09-14 DIAGNOSIS — Z113 Encounter for screening for infections with a predominantly sexual mode of transmission: Secondary | ICD-10-CM | POA: Diagnosis not present

## 2022-09-14 DIAGNOSIS — N950 Postmenopausal bleeding: Secondary | ICD-10-CM | POA: Diagnosis not present

## 2022-09-14 NOTE — Patient Instructions (Addendum)
Postmenopausal Bleeding Postmenopausal bleeding is any bleeding that a woman has after she has entered menopause. Menopause is the end of a woman's fertile years. After menopause, a woman no longer ovulates and does not have menstrual periods. Therefore, she should no longer have bleeding from her vagina. Postmenopausal bleeding may have various causes, including: Menopausal hormone therapy (MHT). Endometrial atrophy. After menopause, low estrogen hormone levels cause the membrane that lines the uterus (endometrium) to become thin. You may have bleeding as the endometrium thins. Endometrial hyperplasia. This condition is caused by excess estrogen hormones and low levels of progesterone hormones. The excess estrogen causes the endometrium to thicken, which can lead to bleeding. In some cases, this can lead to cancer of the uterus. Endometrial cancer. Noncancerous growths (polyps) on the endometrium, the lining of the uterus, or the cervix. Uterine fibroids. These are noncancerous growths in or around the uterus muscle tissue that can cause heavy bleeding. Any type of postmenopausal bleeding, even if it appears to be a typical menstrual period, should be checked by your health care provider. Treatment will depend on the cause of the bleeding. Follow these instructions at home:  Pay attention to any changes in your symptoms. Let your health care provider know about them. Avoid using tampons and douches as told by your health care provider. Change your pads regularly. Get regular pelvic exams, including Pap tests, as told by your health care provider. Take iron supplements as told by your health care provider. Take over-the-counter and prescription medicines only as told by your health care provider. Keep all follow-up visits. This is important. Contact a health care provider if: You have new bleeding from the vagina after menopause. You have pain in your abdomen. Get help right away if: You have  a fever or chills. You have severe pain with bleeding. You are passing blood clots. You have heavy bleeding, need more than 1 pad an hour, and have never experienced this before. You have headaches or feel faint or dizzy. Summary Postmenopausal bleeding is any bleeding that a woman has after she has entered into menopause. Postmenopausal bleeding may have various causes. Treatment will depend on the cause of the bleeding. Any type of postmenopausal bleeding, even if it appears to be a typical menstrual period, should be checked by your health care provider. Be sure to pay attention to any changes in your symptoms and keep all follow-up visits. This information is not intended to replace advice given to you by your health care provider. Make sure you discuss any questions you have with your health care provider. Document Revised: 02/08/2020 Document Reviewed: 02/08/2020 Elsevier Patient Education  [place].   Endometrial Biopsy  An endometrial biopsy is a procedure to remove tissue samples from the endometrium, which is the lining of the uterus. The tissue that is removed can then be checked under a microscope for disease. This procedure is used to diagnose conditions such as endometrial cancer, endometrial tuberculosis, polyps, or other inflammatory conditions. This procedure may also be used to investigate uterine bleeding to determine where you are in your menstrual cycle or how your hormone levels are affecting the lining of the uterus. Tell a health care provider about: Any allergies you have. All medicines you are taking, including vitamins, herbs, eye drops, creams, and over-the-counter medicines. Any problems you or family members have had with anesthetic medicines. Any bleeding problems you have. Any surgeries you have had. Any medical conditions you have. Whether you are pregnant or may be  pregnant. What are the risks? Your health care provider will talk with you about  risks. These may include: Bleeding. Pelvic infection. Puncture of the wall of the uterus with the biopsy device (rare). Allergic reactions to medicines. What happens before the procedure? Keep a record of your menstrual cycles as told by your health care provider. You may need to schedule your procedure for a specific time in your cycle. Bring a sanitary pad in case you need to wear one after the procedure. Ask your health care provider about: Changing or stopping your regular medicines. These include any diabetes medicines or blood thinners you take. Taking medicines such as aspirin and ibuprofen. These medicines can thin your blood. Do not take these medicines unless your health care provider tells you to. Taking over-the-counter medicines, vitamins, herbs, and supplements. Plan to have someone take you home from the hospital or clinic. What happens during the procedure? You will lie on an exam table with your feet and legs supported as in a pelvic exam. Your health care provider will insert an instrument into your vagina to see your cervix. Your cervix will be cleansed with an antiseptic solution. A medicine (local anesthetic) will be used to numb the cervix. A forceps instrument will be used to hold your cervix steady for the biopsy. A thin, rod-like instrument (uterine sound) will be inserted through your cervix to determine the length of your uterus and the location where the biopsy sample will be removed. A thin, flexible tube (catheter) will be inserted through your cervix and into the uterus. The catheter will be used to collect the biopsy sample from your endometrial tissue. The tube and instruments will be removed, and the tissue sample will be sent to a lab for examination. The procedure may vary among health care providers and hospitals. What happens after the procedure? Your blood pressure, heart rate, breathing rate, and blood oxygen level will be monitored until you leave the  hospital or clinic. It is up to you to get the results of your procedure. Ask your health care provider, or the department that is doing the procedure, when your results will be ready. Summary An endometrial biopsy is a procedure to remove tissue samples from the endometrium, which is the lining of the uterus. This procedure is used to diagnose conditions such as endometrial cancer, endometrial tuberculosis, polyps, or other inflammatory conditions. It is up to you to get the results of your procedure. Ask your health care provider, or the department that is doing the procedure, when your results will be ready. This information is not intended to replace advice given to you by your health care provider. Make sure you discuss any questions you have with your health care provider. Document Revised: 12/09/2021 Document Reviewed: 12/09/2021 Elsevier Patient Education  [place].

## 2022-09-15 LAB — CERVICOVAGINAL ANCILLARY ONLY
Chlamydia: NEGATIVE
Comment: NEGATIVE
Comment: NEGATIVE
Comment: NORMAL
Neisseria Gonorrhea: NEGATIVE
Trichomonas: NEGATIVE

## 2022-09-15 LAB — FOLLICLE STIMULATING HORMONE: FSH: 54.3 m[IU]/mL

## 2022-09-15 LAB — ESTRADIOL: Estradiol: 17 pg/mL

## 2022-09-17 ENCOUNTER — Other Ambulatory Visit: Payer: Self-pay | Admitting: *Deleted

## 2022-09-17 DIAGNOSIS — N950 Postmenopausal bleeding: Secondary | ICD-10-CM

## 2022-10-05 NOTE — Progress Notes (Signed)
GYNECOLOGY  VISIT   HPI: 52 y.o.   Divorced  Caucasian  female   [id number] with Patient's last menstrual period was 06/04/2019.   here for endometrial biopsy.   Patient had postmenopausal bleeding in October, 2023.  [place] 54.3 and estradiol 17 on 09/14/22. Pelvic US showed EMS 6.7 mm on 08/04/22.   GYNECOLOGIC HISTORY: Patient's last menstrual period was 06/04/2019. 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   ASCUS of cervix with negative high risk HPV 10/23/2020   Hepatic steatosis 10/23/2020   Adjustment insomnia 01/05/2019   Essential hypertension 03/12/2017   Perimenopausal vasomotor symptoms 03/12/2017   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   Obesity (BMI 30-39.9) 03/09/2016   Seasonal allergies 01/15/2014   GERD (gastroesophageal reflux disease) 09/27/2013   Family history of colon cancer 09/27/2013   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 Wilson Singer, endocrine   PONV (postoperative nausea and vomiting)    Skin cancer    "right arm" (10/19/2016)    Past Surgical History:   Procedure Laterality Date   BREAST SURGERY  12/14/2017   Reduction   DILATION AND CURETTAGE OF UTERUS  X 1   LAPAROSCOPIC CHOLECYSTECTOMY  2005   MOHS SURGERY Right    arm   THYROIDECTOMY N/A 10/19/2016   Procedure: TOTAL THYROIDECTOMY;  Surgeon: Armandina Gemma, MD;  Location: [facility];  Service: General;  Laterality: N/A;   TOTAL THYROIDECTOMY  10/19/2016    Current Outpatient Medications  Medication Sig Dispense Refill   albuterol (VENTOLIN HFA) 108 (90 Base) MCG/ACT inhaler Inhale 2 puffs into the lungs every 6 (six) hours as needed for wheezing or shortness of breath. 1 each 2   ALPRAZolam (XANAX) 0.5 MG tablet Take 0.5-1 tablets (0.25-0.5 mg total) by mouth daily as needed for anxiety. 30 tablet 2   diclofenac (VOLTAREN) 75 MG EC tablet Take 1 tablet (75 mg total) by mouth 2 (two) times daily as needed. 60 tablet 0   dicyclomine (BENTYL) 10 MG capsule Take 1 capsule (10 mg total) by mouth 3 (three) times daily before meals. As needed 90 capsule 1   fluticasone (FLONASE) 50 MCG/ACT nasal spray USE 1 SPRAY INTO BOTH NOSTRILS  DAILY 16 g 5   hydrochlorothiazide (MICROZIDE) 12.5 MG capsule Take 1 capsule (12.5 mg total) by mouth daily. 90 capsule 3   levothyroxine (SYNTHROID) 150 MCG tablet Take 1 tablet (150 mcg total) by mouth daily. 90 tablet 3   lisinopril (ZESTRIL) 40 MG tablet Take 1 tablet (40 mg total) by mouth daily. 90 tablet 3   montelukast (SINGULAIR) 10 MG tablet Take 1 tablet (10 mg total) by mouth daily. 90 tablet 3   valACYclovir (VALTREX) 1000 MG tablet Take 2 tablets (2,000 mg total) by mouth 2 (two) times daily. Once, as needed for cold sores 20 tablet 2   No current facility-administered medications for this visit.     ALLERGIES: Fexofenadine and Amoxicillin  Family History  Problem Relation Age of Onset   Atrial fibrillation Mother    COPD Mother    Hyperlipidemia Mother    Hypertension Mother    Atrial fibrillation Father    Heart disease Sister    Alzheimer's  disease Paternal Grandmother    Colon cancer Paternal Grandfather    Liver disease Sister        Liver Transplant    Social History   Socioeconomic History   Marital status: Divorced    Spouse name: Not on file   Number of children: 0   Years of education: Not on file   Highest education level: Not on file  Occupational History   Not on file  Tobacco Use   Smoking status: Never   Smokeless tobacco: Never  Vaping Use   Vaping Use: Never used  Substance and Sexual Activity   Alcohol use: Yes    Alcohol/week: 3.0 standard drinks of alcohol    Types: 3 Glasses of wine per week    Comment: occasionally   Drug use: No   Sexual activity: Yes  Other Topics Concern   Not on file  Social History Narrative   Not on file   Social Determinants of Health   Financial Resource Strain: Not on file  Food Insecurity: Not on file  Transportation Needs: Not on file  Physical Activity: Not on file  Stress: Not on file  Social Connections: Not on file  Intimate Partner Violence: Not on file    Review of Systems  All other systems reviewed and are negative.   PHYSICAL EXAMINATION:    BP 124/84 (BP Location: Left Arm, Patient Position: Sitting, Cuff Size: Large)   Pulse 81   Ht 5' 4"$  (1.626 m)   Wt 237 lb (107.5 kg)   LMP 06/04/2019   SpO2 98%   BMI 40.68 kg/m     General appearance: alert, cooperative and appears stated age   Endometrial biopsy. Consent done.   Hibiclens prep.  Paracervical block.  Local 1% lidocaine, lot [id number], exp 03/2025. Tenaculum to anterior cervical lip.  Pipelle passed to almost 8 cm x 2.  Tissue to pathology.  No complications.  Minimal EBL.   Chaperone was present for exam:  Raquel Sarna  ASSESSMENT  Postmenopausal bleeding.  Thickened endometrium.   PLAN  FU biopsy.  Post biopsy precautions given. Final plan to follow.    An After Visit Summary was printed and given to the patient.

## 2022-10-13 ENCOUNTER — Ambulatory Visit (INDEPENDENT_AMBULATORY_CARE_PROVIDER_SITE_OTHER): Payer: BC Managed Care – PPO | Admitting: Family Medicine

## 2022-10-13 ENCOUNTER — Encounter: Payer: Self-pay | Admitting: Family Medicine

## 2022-10-13 VITALS — BP 100/72 | HR 74 | Temp 98.2°F | Ht 64.5 in | Wt 235.2 lb

## 2022-10-13 DIAGNOSIS — K219 Gastro-esophageal reflux disease without esophagitis: Secondary | ICD-10-CM

## 2022-10-13 DIAGNOSIS — I10 Essential (primary) hypertension: Secondary | ICD-10-CM

## 2022-10-13 DIAGNOSIS — E669 Obesity, unspecified: Secondary | ICD-10-CM | POA: Diagnosis not present

## 2022-10-13 DIAGNOSIS — F5102 Adjustment insomnia: Secondary | ICD-10-CM | POA: Diagnosis not present

## 2022-10-13 DIAGNOSIS — E890 Postprocedural hypothyroidism: Secondary | ICD-10-CM

## 2022-10-13 DIAGNOSIS — K589 Irritable bowel syndrome without diarrhea: Secondary | ICD-10-CM | POA: Diagnosis not present

## 2022-10-13 DIAGNOSIS — N950 Postmenopausal bleeding: Secondary | ICD-10-CM

## 2022-10-13 DIAGNOSIS — Z0000 Encounter for general adult medical examination without abnormal findings: Secondary | ICD-10-CM

## 2022-10-13 DIAGNOSIS — N951 Menopausal and female climacteric states: Secondary | ICD-10-CM

## 2022-10-13 DIAGNOSIS — K760 Fatty (change of) liver, not elsewhere classified: Secondary | ICD-10-CM

## 2022-10-13 LAB — COMPREHENSIVE METABOLIC PANEL
ALT: 18 U/L (ref 0–35)
AST: 17 U/L (ref 0–37)
Albumin: 4.6 g/dL (ref 3.5–5.2)
Alkaline Phosphatase: 72 U/L (ref 39–117)
BUN: 16 mg/dL (ref 6–23)
CO2: 24 mEq/L (ref 19–32)
Calcium: 9.5 mg/dL (ref 8.4–10.5)
Chloride: 100 mEq/L (ref 96–112)
Creatinine, Ser: 0.75 mg/dL (ref 0.40–1.20)
GFR: 90.92 mL/min (ref >60.00)
Glucose, Bld: 95 mg/dL (ref 70–99)
Potassium: 4 mEq/L (ref 3.5–5.1)
Sodium: 138 mEq/L (ref 135–145)
Total Bilirubin: 0.5 mg/dL (ref 0.2–1.2)
Total Protein: 7 g/dL (ref 6.0–8.3)

## 2022-10-13 LAB — CBC WITH DIFFERENTIAL/PLATELET
Basophils Absolute: 0.1 10*3/uL (ref 0.0–0.1)
Basophils Relative: 0.8 % (ref 0.0–3.0)
Eosinophils Absolute: 0.2 10*3/uL (ref 0.0–0.7)
Eosinophils Relative: 2.7 % (ref 0.0–5.0)
HCT: 41.3 % (ref 36.0–46.0)
Hemoglobin: 14 g/dL (ref 12.0–15.0)
Lymphocytes Relative: 25.1 % (ref 12.0–46.0)
Lymphs Abs: 1.8 10*3/uL (ref 0.7–4.0)
MCHC: 33.9 g/dL (ref 30.0–36.0)
MCV: 89.1 fl (ref 78.0–100.0)
Monocytes Absolute: 0.4 10*3/uL (ref 0.1–1.0)
Monocytes Relative: 6 % (ref 3.0–12.0)
Neutro Abs: 4.7 10*3/uL (ref 1.4–7.7)
Neutrophils Relative %: 65.4 % (ref 43.0–77.0)
Platelets: 312 10*3/uL (ref 150.0–400.0)
RBC: 4.63 Mil/uL (ref 3.87–5.11)
RDW: 14.1 % (ref 11.5–15.5)
WBC: 7.2 10*3/uL (ref 4.0–10.5)

## 2022-10-13 LAB — TSH: TSH: 1.42 u[IU]/mL (ref 0.35–5.50)

## 2022-10-13 LAB — LIPID PANEL
Cholesterol: 216 mg/dL — ABNORMAL HIGH (ref 0–200)
HDL: 59.8 mg/dL (ref >39.00)
LDL Cholesterol: 119 mg/dL — ABNORMAL HIGH (ref 0–99)
NonHDL: 156.07
Total CHOL/HDL Ratio: 4
Triglycerides: 187 mg/dL — ABNORMAL HIGH (ref 0.0–149.0)
VLDL: 37.4 mg/dL (ref 0.0–40.0)

## 2022-10-13 LAB — HEMOGLOBIN A1C: Hgb A1c MFr Bld: 5.7 % (ref 4.6–6.5)

## 2022-10-13 MED ORDER — HYDROCHLOROTHIAZIDE 12.5 MG PO CAPS: 12.5000 mg | ORAL_CAPSULE | Freq: Every day | ORAL | 3 refills | Status: DC

## 2022-10-13 MED ORDER — LISINOPRIL 40 MG PO TABS: 40.0000 mg | ORAL_TABLET | Freq: Every day | ORAL | 3 refills | Status: DC

## 2022-10-13 NOTE — Patient Instructions (Addendum)
Please return in 6 months for hypertension follow up.   I will release your lab results to you on your MyChart account with further instructions. You may see the results before I do, but when I review them I will send you a message with my report or have my assistant call you if things need to be discussed. Please reply to my message with any questions. Thank you!   I am so thrilled for you!!!!  Wt Readings from Last 3 Encounters:  10/13/22 235 lb 3.2 oz (106.7 kg)   07/28/2021     247 LB   If you have any questions or concerns, please don't hesitate to send me a message via MyChart or call the office at [phone]. Thank you for visiting with [place] today! It's our pleasure caring for you.

## 2022-10-13 NOTE — Progress Notes (Signed)
Subjective  Chief Complaint  Patient presents with   Annual Exam    Pt here for Annual exam and is currently fastinfg    HPI: Angelica Lester is a 52 y.o. female who presents to [facility] [place] at [place] today for a Female Wellness Visit. She also has the concerns and/or needs as listed above in the chief complaint. These will be addressed in addition to the Health Maintenance Visit.   Wellness Visit: annual visit with health maintenance review and exam without Pap  Health maintenance: Had normal Pap smear with negative high-risk HPV last year.  Mammogram is current.  Colonoscopy is current.  Immunizations up-to-date Chronic disease f/u and/or acute problem visit: (deemed necessary to be done in addition to the wellness visit): Obesity: She is losing weight!  She is eating healthier, walking for exercise and over the last year is down approximately 20 pounds.  She feels better.  Continues to be motivated for persistent weight loss. Hypertension: Very normal now on lisinopril 40 mg daily and HCTZ 12.5 daily. Hypothyroidism: On levothyroxine 175 mcg daily.  Energy levels are good.  Compliant with medication. History of fatty liver and GERD and IBS: Currently all stable, not requiring antacids or proton pump inhibitors.  Rare Levsin use.  Low-fat diet Adjustment insomnia uses Xanax as needed. Hot flashes have improved Postmenopausal bleeding: Scheduled for endometrial biopsy next week  Assessment  1. Annual physical exam   2. Postoperative hypothyroidism   3. Essential hypertension   4. Irritable bowel syndrome, unspecified type   5. Obesity (BMI 30-39.9)   6. Gastroesophageal reflux disease without esophagitis   7. Adjustment insomnia   8. Perimenopausal vasomotor symptoms   9. Hepatic steatosis   10. Postmenopausal bleeding      Plan  Female Wellness Visit: Age appropriate Health Maintenance and Prevention measures were discussed with patient. Included topics are  cancer screening recommendations, ways to keep healthy (see AVS) including dietary and exercise recommendations, regular eye and dental care, use of seat belts, and avoidance of moderate alcohol use and tobacco use.  BMI: discussed patient's BMI and encouraged positive lifestyle modifications to help get to or maintain a target BMI. HM needs and immunizations were addressed and ordered. See below for orders. See HM and immunization section for updates. Routine labs and screening tests ordered including cmp, cbc and lipids where appropriate. Discussed recommendations regarding Vit D and calcium supplementation (see AVS)  Chronic disease management visit and/or acute problem visit: Obesity: Praised for dietary and exercise changes.  Continue Hypothyroidism: Recheck TSH.  Clinically euthyroid.  Adjust levothyroxine 175 mcg daily if needed Hypertension is well-controlled on lisinopril 40 and HCTZ 12.5 daily.  Blood pressure running low without symptoms today.  When checked outside of the office she reports 120s over 70s.  Will continue current dose and recheck renal function electrolytes and lipids.  May be able to cut back on medication dose in the future with persistent weight loss GI: Stable GERD, IBS.  Recheck with LFTs with history of fatty liver.  Likely should be improved Postmenopausal bleeding: Endometrial biopsy scheduled with GYN next week.  Reassured.  Has had no further bleeding episodes  Follow up: 6 months for blood pressure and weight check Orders Placed This Encounter  Procedures   CBC with Differential/Platelet   Comprehensive metabolic panel   Hemoglobin A1c   Lipid panel   TSH   Meds ordered this encounter  Medications   hydrochlorothiazide (MICROZIDE) 12.5 MG capsule  Sig: Take 1 capsule (12.5 mg total) by mouth daily.    Dispense:  90 capsule    Refill:  3   lisinopril (ZESTRIL) 40 MG tablet    Sig: Take 1 tablet (40 mg total) by mouth daily.    Dispense:  90 tablet     Refill:  3      Body mass index is 39.75 kg/m. 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    Obesity (BMI 30-39.9) 03/09/2016    Priority: High   Family history of colon cancer 09/27/2013    Priority: High   Hepatic steatosis 10/23/2020    Priority: Medium     With elevated liver tests: by ultrasound 10/2020    Adjustment insomnia 01/05/2019    Priority: Medium    GERD (gastroesophageal reflux disease) 09/27/2013    Priority: Medium    Irritable bowel syndrome 08/28/2010    Priority: Medium     Overview:  Constipation predominant    Migraine headache 02/24/2010    Priority: Medium    Perimenopausal vasomotor symptoms 03/12/2017    Priority: Low   Seasonal allergies 01/15/2014    Priority: Low   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    Health Maintenance  Topic Date Due   COVID-19 Vaccine (4 - 2023-24 season) 10/29/2022 (Originally 05/08/2022)   MAMMOGRAM  03/06/2023   PAP SMEAR-Modifier  01/08/2027   DTaP/Tdap/Td (4 - Td or Tdap) 05/18/2029   COLONOSCOPY (Pts 45-18yr Insurance coverage will need to be confirmed)  06/27/2029   INFLUENZA VACCINE  Completed   Hepatitis C Screening  Completed   HIV Screening  Completed   Zoster Vaccines- Shingrix  Completed   HPV VACCINES  Aged Out   Immunization History  Administered Date(s) Administered   Influenza Split 06/25/2011   Influenza, Seasonal, Injecte, Preservative Fre 07/09/2015   Influenza,inj,Quad PF,6+  Mos 06/21/2014, 05/19/2019, 06/12/2019, 07/22/2022   Influenza-Unspecified 06/07/2017, 09/13/2020   PFIZER(Purple Top)SARS-COV-2 Vaccination 11/22/2019, 12/06/2019, 09/13/2020   Tdap 09/08/2003, 03/08/2012, 05/19/2019   Zoster Recombinat (Shingrix) 06/12/2019, 09/27/2019   We updated and reviewed the patient's past history in detail and it is documented below. Allergies: Patient is allergic to fexofenadine and amoxicillin. 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 Past Surgical History Patient  has a past surgical history that includes Total thyroidectomy (10/19/2016); Mohs surgery (Right); Dilation and curettage of uterus (X 1); Thyroidectomy (N/A, 10/19/2016); Laparoscopic cholecystectomy (2005); and Breast surgery (12/14/2017). Family History: Patient family history includes Alzheimer's disease in her paternal grandmother; Atrial fibrillation in her father and mother; COPD in her mother; Colon cancer in her paternal grandfather; Heart disease in her sister; Hyperlipidemia in her mother; Hypertension in her mother; Liver disease in  her sister. Social History:  Patient  reports that she has never smoked. She has never used smokeless tobacco. She reports current alcohol use of about 3.0 standard drinks of alcohol per week. She reports that she does not use drugs.  Review of Systems: Constitutional: negative for fever or malaise Ophthalmic: negative for photophobia, double vision or loss of vision Cardiovascular: negative for chest pain, dyspnea on exertion, or new LE swelling Respiratory: negative for SOB or persistent cough Gastrointestinal: negative for abdominal pain, change in  bowel habits or melena Genitourinary: negative for dysuria or gross hematuria, no abnormal uterine bleeding or disharge Musculoskeletal: negative for new gait disturbance or muscular weakness Integumentary: negative for new or persistent rashes, no breast lumps Neurological: negative for TIA or stroke symptoms Psychiatric: negative for SI or delusions Allergic/Immunologic: negative for hives  Patient Care Team    Relationship Specialty Notifications Start End  Leamon Arnt, MD PCP - General Family Medicine  10/12/16   Madelin Rear, MD Consulting Physician Endocrinology  10/01/17   Jari Pigg, MD Consulting Physician Dermatology  10/17/20     Objective  Vitals: BP 100/72   Pulse 74   Temp 98.2 F (36.8 C)   Ht 5' 4.5" (1.638 m)   Wt 235 lb 3.2 oz (106.7 kg)   LMP 06/04/2019   SpO2 97%   BMI 39.75 kg/m  General:  Well developed, well nourished, no acute distress  Psych:  Alert and orientedx3,normal mood and affect, appears happy HEENT:  Normocephalic, atraumatic, non-icteric sclera,  supple neck without adenopathy, mass or thyromegaly Cardiovascular:  Normal S1, S2, RRR without gallop, rub or murmur Respiratory:  Good breath sounds bilaterally, CTAB with normal respiratory effort Gastrointestinal: normal bowel sounds, soft, non-tender, no noted masses. No HSM MSK: no deformities, contusions. Joints are without erythema or swelling.  Skin:  Warm, no rashes or suspicious lesions noted Neurologic:    Mental status is normal. CN 2-11 are normal. Gross motor and sensory exams are normal. Normal gait. No tremor   Commons side effects, risks, benefits, and alternatives for medications and treatment plan prescribed today were discussed, and the patient expressed understanding of the given instructions. Patient is instructed to call or message via MyChart if he/she has any questions or concerns regarding our treatment plan. No barriers to understanding were identified. We discussed  Red Flag symptoms and signs in detail. Patient expressed understanding regarding what to do in case of urgent or emergency type symptoms.  Medication list was reconciled, printed and provided to the patient in AVS. Patient instructions and summary information was reviewed with the patient as documented in the AVS. This note was prepared with assistance of Dragon voice recognition software. Occasional wrong-word or sound-a-like substitutions may have occurred due to the inherent limitations of voice recognition software

## 2022-10-19 ENCOUNTER — Ambulatory Visit (INDEPENDENT_AMBULATORY_CARE_PROVIDER_SITE_OTHER): Payer: BC Managed Care – PPO | Admitting: Obstetrics and Gynecology

## 2022-10-19 ENCOUNTER — Encounter: Payer: Self-pay | Admitting: Obstetrics and Gynecology

## 2022-10-19 ENCOUNTER — Other Ambulatory Visit (HOSPITAL_COMMUNITY)
Admission: RE | Admit: 2022-10-19 | Discharge: 2022-10-19 | Disposition: A | Discharge status: Home / Self Care | Payer: BC Managed Care – PPO | Source: Ambulatory Visit | Attending: Obstetrics and Gynecology | Admitting: Obstetrics and Gynecology

## 2022-10-19 VITALS — BP 124/84 | HR 81 | Ht 64.0 in | Wt 237.0 lb

## 2022-10-19 DIAGNOSIS — N950 Postmenopausal bleeding: Secondary | ICD-10-CM | POA: Insufficient documentation

## 2022-10-19 DIAGNOSIS — R9389 Abnormal findings on diagnostic imaging of other specified body structures: Secondary | ICD-10-CM | POA: Diagnosis not present

## 2022-10-19 NOTE — Patient Instructions (Signed)
Endometrial Biopsy  An endometrial biopsy is a procedure to remove tissue samples from the endometrium, which is the lining of the uterus. The tissue that is removed can then be checked under a microscope for disease. This procedure is used to diagnose conditions such as endometrial cancer, endometrial tuberculosis, polyps, or other inflammatory conditions. This procedure may also be used to investigate uterine bleeding to determine where you are in your menstrual cycle or how your hormone levels are affecting the lining of the uterus. Tell a health care provider about: Any allergies you have. All medicines you are taking, including vitamins, herbs, eye drops, creams, and over-the-counter medicines. Any problems you or family members have had with anesthetic medicines. Any bleeding problems you have. Any surgeries you have had. Any medical conditions you have. Whether you are pregnant or may be pregnant. What are the risks? Your health care provider will talk with you about risks. These may include: Bleeding. Pelvic infection. Puncture of the wall of the uterus with the biopsy device (rare). Allergic reactions to medicines. What happens before the procedure? Keep a record of your menstrual cycles as told by your health care provider. You may need to schedule your procedure for a specific time in your cycle. Bring a sanitary pad in case you need to wear one after the procedure. Ask your health care provider about: Changing or stopping your regular medicines. These include any diabetes medicines or blood thinners you take. Taking medicines such as aspirin and ibuprofen. These medicines can thin your blood. Do not take these medicines unless your health care provider tells you to. Taking over-the-counter medicines, vitamins, herbs, and supplements. Plan to have someone take you home from the hospital or clinic. What happens during the procedure? You will lie on an exam table with your feet  and legs supported as in a pelvic exam. Your health care provider will insert an instrument into your vagina to see your cervix. Your cervix will be cleansed with an antiseptic solution. A medicine (local anesthetic) will be used to numb the cervix. A forceps instrument will be used to hold your cervix steady for the biopsy. A thin, rod-like instrument (uterine sound) will be inserted through your cervix to determine the length of your uterus and the location where the biopsy sample will be removed. A thin, flexible tube (catheter) will be inserted through your cervix and into the uterus. The catheter will be used to collect the biopsy sample from your endometrial tissue. The tube and instruments will be removed, and the tissue sample will be sent to a lab for examination. The procedure may vary among health care providers and hospitals. What happens after the procedure? Your blood pressure, heart rate, breathing rate, and blood oxygen level will be monitored until you leave the hospital or clinic. It is up to you to get the results of your procedure. Ask your health care provider, or the department that is doing the procedure, when your results will be ready. Summary An endometrial biopsy is a procedure to remove tissue samples from the endometrium, which is the lining of the uterus. This procedure is used to diagnose conditions such as endometrial cancer, endometrial tuberculosis, polyps, or other inflammatory conditions. It is up to you to get the results of your procedure. Ask your health care provider, or the department that is doing the procedure, when your results will be ready. This information is not intended to replace advice given to you by your health care provider. Make sure you  discuss any questions you have with your health care provider. Document Revised: 12/09/2021 Document Reviewed: 12/09/2021 Elsevier Patient Education  [place].

## 2022-10-20 ENCOUNTER — Ambulatory Visit: Payer: BC Managed Care – PPO | Admitting: Family Medicine

## 2022-10-20 VITALS — BP 100/70 | HR 67 | Temp 98.1°F | Ht 64.0 in | Wt 238.6 lb

## 2022-10-20 DIAGNOSIS — L719 Rosacea, unspecified: Secondary | ICD-10-CM

## 2022-10-20 DIAGNOSIS — N950 Postmenopausal bleeding: Secondary | ICD-10-CM | POA: Diagnosis not present

## 2022-10-20 MED ORDER — METRONIDAZOLE 0.75 % EX CREA: TOPICAL_CREAM | Freq: Two times a day (BID) | CUTANEOUS | 2 refills | Status: DC

## 2022-10-20 NOTE — Patient Instructions (Signed)
Please follow up if symptoms do not improve or as needed.    Rosacea Rosacea is a long-term (chronic) condition that affects the skin of the face, including the cheeks, nose, forehead, and chin. This condition can also affect the eyes. Rosacea causes blood vessels near the surface of the skin to enlarge, which results in redness. What are the causes? The cause of this condition is not known. Certain triggers can make rosacea worse, including: Exercise. Sunlight. Very hot or cold temperatures. Hot or spicy foods and drinks. Drinking alcohol. Stress. Taking blood pressure medicine. Long-term use of topical steroids on the face. What increases the risk? You are more likely to develop this condition if you: Are older than 54 years of age. Are a woman. Have light-colored skin (light complexion). Have a family history of rosacea. What are the signs or symptoms? Symptoms of this condition include: Redness of the face. Red bumps or pimples on the face. A red, enlarged nose. Blushing easily. Red lines on the skin. Eye problems such as: Irritated, burning, or itchy feeling in the eyes. Swollen eyelids. Drainage from the eyes. Feeling like there is something in your eye. How is this diagnosed? This condition is diagnosed with a medical history and physical exam. How is this treated? There is no cure for this condition, but treatment can help to control your symptoms. Your health care provider may recommend that you see a skin specialist (dermatologist). Treatment may include: Medicines that are applied to the skin or taken by mouth (orally). This can include antibiotic medicines. Laser treatment to improve the appearance of the skin. Surgery. This is rare. Your health care provider will also recommend the best way to take care of your skin. Even after your skin improves, you will likely need to continue treatment to prevent your rosacea from coming back. Follow these instructions at  home: Skin care Take care of your skin as told by your health care provider. You may be told to do these things: Wash your skin gently two or more times each day. Use mild soap. Use a sunscreen or sunblock with SPF 30 or greater. Use gentle cosmetics that are meant for sensitive skin. Shave with an electric shaver instead of a blade. Lifestyle Try to keep track of what foods trigger this condition. Avoid any triggers. These may include: Spicy foods. Seafood. Cheese. Hot liquids. Nuts. Chocolate. Iodized salt. Do not drink alcohol. Avoid extremely cold or hot temperatures. Try to reduce your stress. If you need help, talk with your health care provider. When you exercise, do these things to stay cool: Limit sun exposure to your face. Use a fan. Do shorter and more frequent intervals of exercise. General instructions Take and apply over-the-counter and prescription medicines only as told by your health care provider. If you were prescribed antibiotics, apply it or take them as told by your health care provider. Do not stop using the antibiotic even if your condition improves. If your eyelids are affected, apply warm compresses to them. Do this as told by your health care provider. Keep all follow-up visits. Contact a health care provider if: Your symptoms get worse. Your symptoms do not improve after 2 months of treatment. You have new symptoms. You have any changes in vision or you have problems with your eyes, such as redness or itching. You feel depressed. You lose your appetite. You have trouble concentrating. Summary Rosacea is a long-term (chronic) condition that affects the skin of the face, including the cheeks, nose,  forehead, and chin. Take care of your skin as told by your health care provider. Take and apply over-the-counter and prescription medicines only as told by your health care provider. Contact a health care provider if your symptoms get worse or if you have  any changes in vision or other problems with your eyes, such as redness or itching. Keep all follow-up visits. This information is not intended to replace advice given to you by your health care provider. Make sure you discuss any questions you have with your health care provider. Document Revised: 10/15/2021 Document Reviewed: 10/15/2021 Elsevier Patient Education  [place].

## 2022-10-20 NOTE — Progress Notes (Signed)
Subjective  CC:  Chief Complaint  Patient presents with   Rash    Pt stated that she has had a rash on her face for the past 4 weeks. It started on the rt side and the spread to the Lt. Does not hurt but it does itch.    HPI: Angelica Lester is a 52 y.o. female who presents to the office today to address the problems listed above in the chief complaint. 52 year old menopausal female presents with rash.  Started on her right upper forehead, dryness and redness some red bumps were palpable.  Mild itching.  Over the last 4 weeks has progressed to include most of her face now including both cheeks.  She does have a remote history of rosacea.  Skin is very dry.  No ulcerations.  Had some pustules that have since resolved.  Has tried buying new make-up.  She uses a natural face wash.  No other new topicals.   Postmenopausal bleeding: Status post endometrial biopsy yesterday. Assessment  1. Rosacea   2. Postmenopausal bleeding      Plan  Facial rash, likely rosacea: Discussed skin care, will for moisturizers and MetroCream twice daily.  Follow-up if not improving. Await endometrial biopsy per GYN.  Follow up:  As needed 04/13/2023  No orders of the defined types were placed in this encounter.  Meds ordered this encounter  Medications   metroNIDAZOLE (METROCREAM) 0.75 % cream    Sig: Apply topically 2 (two) times daily.    Dispense:  45 g    Refill:  2      I reviewed the patients updated PMH, FH, and SocHx.    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   ALPRAZolam (XANAX) 0.5 MG tablet Take 0.5-1 tablets (0.25-0.5 mg total) by mouth daily as needed for anxiety.   diclofenac (VOLTAREN) 75 MG EC tablet Take 1 tablet (75 mg total) by mouth 2 (two) times daily as needed.   dicyclomine (BENTYL) 10 MG capsule Take 1 capsule (10 mg total) by mouth 3 (three) times daily before meals. As needed   fluticasone (FLONASE) 50 MCG/ACT nasal spray USE 1 SPRAY INTO BOTH NOSTRILS DAILY   hydrochlorothiazide (MICROZIDE) 12.5 MG capsule Take 1 capsule (12.5 mg total) by mouth daily.   levothyroxine (SYNTHROID) 150 MCG tablet Take 1 tablet (150  mcg total) by mouth daily.   lisinopril (ZESTRIL) 40 MG tablet Take 1 tablet (40 mg total) by mouth daily.   metroNIDAZOLE (METROCREAM) 0.75 % cream Apply topically 2 (two) times daily.   montelukast (SINGULAIR) 10 MG tablet Take 1 tablet (10 mg total) by mouth daily.   valACYclovir (VALTREX) 1000 MG tablet Take 2 tablets (2,000 mg total) by mouth 2 (two) times daily. Once, as needed for cold sores    Allergies: Patient is allergic to fexofenadine and amoxicillin. Family History: Patient family history includes Alzheimer's disease in her paternal grandmother; Atrial fibrillation in her father and mother; COPD in her mother; Colon cancer in her paternal grandfather; Heart disease in her sister; Hyperlipidemia in her mother; Hypertension in her  mother; Liver disease in her sister. Social History:  Patient  reports that she has never smoked. She has never used smokeless tobacco. She reports current alcohol use of about 3.0 standard drinks of alcohol per week. She reports that she does not use drugs.  Review of Systems: Constitutional: Negative for fever malaise or anorexia Cardiovascular: negative for chest pain Respiratory: negative for SOB or persistent cough Gastrointestinal: negative for abdominal pain  Objective  Vitals: BP 100/70   Pulse 67   Temp 98.1 F (36.7 C)   Ht 5' 4"$  (1.626 m)   Wt 238 lb 9.6 oz (108.2 kg)   LMP 06/04/2019   SpO2 98%   BMI 40.96 kg/m  General: no acute distress , A&Ox3 Skin face with diffuse erythematous rash, very dry, no scales, some papules.  No pustules.  No urticaria or ulcerations.  No flaking  Commons side effects, risks, benefits, and alternatives for medications and treatment plan prescribed today were discussed, and the patient expressed understanding of the given instructions. Patient is instructed to call or message via MyChart if he/she has any questions or concerns regarding our treatment plan. No barriers to understanding were identified. We discussed Red Flag symptoms and signs in detail. Patient expressed understanding regarding what to do in case of urgent or emergency type symptoms.  Medication list was reconciled, printed and provided to the patient in AVS. Patient instructions and summary information was reviewed with the patient as documented in the AVS. This note was prepared with assistance of Dragon voice recognition software. Occasional wrong-word or sound-a-like substitutions may have occurred due to the inherent limitations of voice recognition software

## 2022-10-21 LAB — SURGICAL PATHOLOGY

## 2022-11-05 ENCOUNTER — Encounter: Payer: Self-pay | Admitting: Family Medicine

## 2022-11-10 MED ORDER — BRIMONIDINE TARTRATE 0.33 % EX GEL: CUTANEOUS | 5 refills | Status: DC

## 2022-11-23 ENCOUNTER — Encounter: Payer: Self-pay | Admitting: Family Medicine

## 2022-11-23 ENCOUNTER — Ambulatory Visit: Payer: BC Managed Care – PPO | Admitting: Family Medicine

## 2022-11-23 VITALS — BP 116/86 | HR 79 | Temp 98.6°F | Ht 64.0 in | Wt 237.0 lb

## 2022-11-23 DIAGNOSIS — J0100 Acute maxillary sinusitis, unspecified: Secondary | ICD-10-CM | POA: Diagnosis not present

## 2022-11-23 MED ORDER — GUAIFENESIN-CODEINE 100-10 MG/5ML PO SOLN: 5.0000 mL | Freq: Four times a day (QID) | ORAL | 0 refills | Status: DC | PRN

## 2022-11-23 MED ORDER — AZITHROMYCIN 250 MG PO TABS: ORAL_TABLET | ORAL | 0 refills | Status: DC

## 2022-11-23 NOTE — Patient Instructions (Signed)
Please follow up as scheduled for your next visit with me: 04/13/2023   If you have any questions or concerns, please don't hesitate to send me a message via MyChart or call the office at [phone]. Thank you for visiting with [place] today! It's our pleasure caring for you.

## 2022-11-23 NOTE — Progress Notes (Signed)
Subjective   CC:  Chief Complaint  Patient presents with   Cough    Pt stated that she has been coughing since Saturday with pain and pressure behind her eyes. COVID test yesterday was negative.    HPI: Angelica Lester is a 52 y.o. female who presents to the office today to address the problems listed above in the chief complaint. Patient reports sinus congestion and pressure with thick drainage, hacking nonproductive cough from drainage, sinuse pain L>R, and mild malaise.  Symptoms have been present for several days.Shedenies high fevers, GI symptoms, shortness of breath. Shehas had sinus infections in the past and this feels similar.  Patient is not a non-smoker.  No history of asthma or COPD.  I reviewed the patients updated PMH, FH, and SocHx.    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   ALPRAZolam (XANAX) 0.5 MG tablet Take 0.5-1 tablets (0.25-0.5 mg total) by mouth daily as needed  for anxiety.   azithromycin (ZITHROMAX) 250 MG tablet Take 2 tabs today, then 1 tab daily for 4 days   Brimonidine Tartrate 0.33 % GEL Apply to face twice daily   diclofenac (VOLTAREN) 75 MG EC tablet Take 1 tablet (75 mg total) by mouth 2 (two) times daily as needed.   dicyclomine (BENTYL) 10 MG capsule Take 1 capsule (10 mg total) by mouth 3 (three) times daily before meals. As needed   fluticasone (FLONASE) 50 MCG/ACT nasal spray USE 1 SPRAY INTO BOTH NOSTRILS DAILY   guaiFENesin-codeine 100-10 MG/5ML syrup Take 5 mLs by mouth every 6 (six) hours as needed for cough.   hydrochlorothiazide (MICROZIDE) 12.5 MG capsule Take 1 capsule (12.5 mg total) by mouth daily.   levothyroxine (SYNTHROID) 150 MCG tablet Take 1 tablet (150 mcg total) by mouth daily.   lisinopril (ZESTRIL) 40 MG tablet Take 1 tablet (40 mg total) by mouth daily.   metroNIDAZOLE (METROCREAM) 0.75 % cream Apply topically 2 (two) times daily.   montelukast (SINGULAIR) 10 MG tablet Take 1 tablet (10 mg total) by mouth daily.   valACYclovir (VALTREX) 1000 MG tablet Take 2 tablets (2,000 mg total) by mouth 2 (two) times daily. Once, as needed for cold sores    Review of Systems: Cardiovascular: negative for chest pain Respiratory: negative for SOB or persistent cough Gastrointestinal: negative for abdominal pain Genitourinary: negative  for dysuria or gross hematuria  Objective  Vitals: BP 116/86   Pulse 79   Temp 98.6 F (37 C)   Ht 5\' 4"  (1.626 m)   Wt 237 lb (107.5 kg)   LMP 06/04/2019   SpO2 97%   BMI 40.68 kg/m  General: no acute distress  Psych:  Alert and oriented, normal mood and affect HEENT:  Normocephalic, atraumatic, tender maxillary sinus present, OP mild erythematous w/o eudate, supple neck without LAD Cardiovascular:  RRR without murmur or gallop. no peripheral edema Respiratory:  Good breath sounds bilaterally, CTAB with normal respiratory effort   Assessment  1. Acute non-recurrent maxillary  sinusitis      Plan   Sinusitis: History and exam is most consistent with bacterial sinus infection.  Etiology and prognosis discussed with patient.  Recommend antibiotics as ordered below.  Patient to complete course of antibiotics, use supportive medications like mucolytics and decongestants as needed.  May use Tylenol or Advil if needed.  Symptoms should improve over the next 2 weeks.  Patient will return or call if symptoms persist or worsen.  Follow up: as scheduled   Commons side effects, risks, benefits, and alternatives for medications and treatment plan prescribed today were discussed, and the patient expressed understanding of the given instructions. Patient is instructed to call or message via MyChart if he/she has any questions or concerns regarding our treatment plan. No barriers to understanding were identified. We discussed Red Flag symptoms and signs in detail. Patient expressed understanding regarding what to do in case of urgent or emergency type symptoms.  Medication list was reconciled, printed and provided to the patient in AVS. Patient instructions and summary information was reviewed with the patient as documented in the AVS. This note was prepared with assistance of Dragon voice recognition software. Occasional wrong-word or sound-a-like substitutions may have occurred due to the inherent limitations of voice recognition software  No orders of the defined types were placed in this encounter.  Meds ordered this encounter  Medications   guaiFENesin-codeine 100-10 MG/5ML syrup    Sig: Take 5 mLs by mouth every 6 (six) hours as needed for cough.    Dispense:  120 mL    Refill:  0   azithromycin (ZITHROMAX) 250 MG tablet    Sig: Take 2 tabs today, then 1 tab daily for 4 days    Dispense:  1 each    Refill:  0

## 2023-01-12 ENCOUNTER — Encounter: Payer: BC Managed Care – PPO | Admitting: Family Medicine

## 2023-02-24 ENCOUNTER — Ambulatory Visit: Payer: BC Managed Care – PPO | Admitting: Family

## 2023-02-24 VITALS — BP 103/71 | HR 72 | Temp 98.0°F | Ht 64.0 in | Wt 232.8 lb

## 2023-02-24 DIAGNOSIS — R053 Chronic cough: Secondary | ICD-10-CM

## 2023-02-24 MED ORDER — GUAIFENESIN-CODEINE 100-10 MG/5ML PO SOLN: 5.0000 mL | Freq: Four times a day (QID) | ORAL | 0 refills | Status: DC | PRN

## 2023-02-24 MED ORDER — ALBUTEROL SULFATE HFA 108 (90 BASE) MCG/ACT IN AERS
2.0000 | INHALATION_SPRAY | Freq: Four times a day (QID) | RESPIRATORY_TRACT | 2 refills | Status: AC | PRN
Start: 2023-02-24 — End: ?

## 2023-02-24 MED ORDER — PREDNISONE 10 MG PO TABS
ORAL_TABLET | ORAL | 0 refills | Status: DC
Start: 2023-02-24 — End: 2023-04-13

## 2023-02-24 NOTE — Progress Notes (Signed)
Patient ID: Angelica Lester, female    DOB: 05-20-1971, 52 y.o.   MRN: [id number]  Chief Complaint  Patient presents with   Cough    Pt c/o deep Cough, Nasal/chest congestion for a week. Has tried guaifenesin syrup,delsym, singular, allegra and benadryl which did not help.     HPI:      Persistent cough:  Pt c/o deep Cough, Nasal/chest congestion for a week. Denies fever, aches/pains, or sore throat. States sx started as sinus drainage but just for 1-2d, then down into her chest, better for a day, but woke up yesterday & cough was worse again. Has tried guaifenesin syrup,delsym, singulair, allegra and benadryl which did not help. Pt has a hx of bronchospasm w/URIs in past and has used an inhaler, states one at home is expired.      Assessment & Plan:  1. Persistent cough - sending prednisone pack & refilling cough syrup and Albuterol inhaler. Advised on use & SE of all meds. Advised on increasing her water intake to at least 2L per day. Call back if sx are no improved after finishing Prednisone. Advised to not take OTC NSAIDs while on the prednisone.  - guaiFENesin-codeine 100-10 MG/5ML syrup; Take 5 mLs by mouth every 6 (six) hours as needed for cough.  Dispense: 120 mL; Refill: 0 - predniSONE (DELTASONE) 10 MG tablet; Take in the morning with breakfast. Take 4 tabs qd x 2 days, 3 qd x 2 days, 2 qd x 2d, 1qd x 2 days  Dispense: 20 tablet; Refill: 0 - albuterol (VENTOLIN HFA) 108 (90 Base) MCG/ACT inhaler; Inhale 2 puffs into the lungs every 6 (six) hours as needed for wheezing or shortness of breath.  Dispense: 1 each; Refill: 2   Subjective:    Outpatient Medications Prior to Visit  Medication Sig Dispense Refill   albuterol (VENTOLIN HFA) 108 (90 Base) MCG/ACT inhaler Inhale 2 puffs into the lungs every 6 (six) hours as needed for wheezing or shortness of breath. 1 each 2   ALPRAZolam (XANAX) 0.5 MG tablet Take 0.5-1 tablets (0.25-0.5 mg total) by mouth daily as needed for anxiety. 30  tablet 2   azithromycin (ZITHROMAX) 250 MG tablet Take 2 tabs today, then 1 tab daily for 4 days 1 each 0   diclofenac (VOLTAREN) 75 MG EC tablet Take 1 tablet (75 mg total) by mouth 2 (two) times daily as needed. 60 tablet 0   dicyclomine (BENTYL) 10 MG capsule Take 1 capsule (10 mg total) by mouth 3 (three) times daily before meals. As needed 90 capsule 1   fluticasone (FLONASE) 50 MCG/ACT nasal spray USE 1 SPRAY INTO BOTH NOSTRILS DAILY 16 g 5   guaiFENesin-codeine 100-10 MG/5ML syrup Take 5 mLs by mouth every 6 (six) hours as needed for cough. 120 mL 0   hydrochlorothiazide (MICROZIDE) 12.5 MG capsule Take 1 capsule (12.5 mg total) by mouth daily. 90 capsule 3   levothyroxine (SYNTHROID) 150 MCG tablet Take 1 tablet (150 mcg total) by mouth daily. 90 tablet 3   lisinopril (ZESTRIL) 40 MG tablet Take 1 tablet (40 mg total) by mouth daily. 90 tablet 3   montelukast (SINGULAIR) 10 MG tablet Take 1 tablet (10 mg total) by mouth daily. 90 tablet 3   valACYclovir (VALTREX) 1000 MG tablet Take 2 tablets (2,000 mg total) by mouth 2 (two) times daily. Once, as needed for cold sores 20 tablet 2   Brimonidine Tartrate 0.33 % GEL Apply to face twice daily 30 g  5   metroNIDAZOLE (METROCREAM) 0.75 % cream Apply topically 2 (two) times daily. 45 g 2   No facility-administered medications prior to visit.   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 Juleen China, endocrine   PONV (postoperative nausea and vomiting)    Skin cancer    "right arm" (10/19/2016)   Past Surgical History:  Procedure Laterality Date   BREAST SURGERY  12/14/2017   Reduction    DILATION AND CURETTAGE OF UTERUS  X 1   LAPAROSCOPIC CHOLECYSTECTOMY  2005   MOHS SURGERY Right    arm   THYROIDECTOMY N/A 10/19/2016   Procedure: TOTAL THYROIDECTOMY;  Surgeon: Darnell Level, MD;  Location: [facility] OR;  Service: General;  Laterality: N/A;   TOTAL THYROIDECTOMY  10/19/2016   Allergies  Allergen Reactions   Fexofenadine Other (See Comments)    myalgias myalgias    Amoxicillin Nausea Only      Objective:    Physical Exam Vitals and nursing note reviewed.  Constitutional:      Appearance: Normal appearance. She is ill-appearing.  Cardiovascular:     Rate and Rhythm: Normal rate and regular rhythm.  Pulmonary:     Effort: Pulmonary effort is normal.     Breath sounds: No wheezing or rhonchi.  Musculoskeletal:        General: Normal range of motion.  Skin:    General: Skin is warm and dry.  Neurological:     Mental Status: She is alert.  Psychiatric:        Mood and Affect: Mood normal.        Behavior: Behavior normal.    BP 103/71   Pulse 72   Temp 98 F (36.7 C) (Temporal)   Ht 5\' 4"  (1.626 m)   Wt 232 lb 12.8 oz (105.6 kg)   LMP 06/04/2019   SpO2 96%   BMI 39.96 kg/m  Wt Readings from Last 3 Encounters:  02/24/23 232 lb 12.8 oz (105.6 kg)  11/23/22 237 lb (107.5 kg)  10/20/22 238 lb 9.6 oz (108.2 kg)      Dulce Sellar, NP

## 2023-03-04 DIAGNOSIS — L821 Other seborrheic keratosis: Secondary | ICD-10-CM | POA: Diagnosis not present

## 2023-03-04 DIAGNOSIS — D225 Melanocytic nevi of trunk: Secondary | ICD-10-CM | POA: Diagnosis not present

## 2023-03-04 DIAGNOSIS — L578 Other skin changes due to chronic exposure to nonionizing radiation: Secondary | ICD-10-CM | POA: Diagnosis not present

## 2023-03-08 DIAGNOSIS — Z1231 Encounter for screening mammogram for malignant neoplasm of breast: Secondary | ICD-10-CM | POA: Diagnosis not present

## 2023-03-08 LAB — HM MAMMOGRAPHY

## 2023-04-13 ENCOUNTER — Ambulatory Visit: Payer: BC Managed Care – PPO | Admitting: Family Medicine

## 2023-04-13 ENCOUNTER — Encounter: Payer: Self-pay | Admitting: Family Medicine

## 2023-04-13 VITALS — BP 124/82 | HR 78 | Temp 98.3°F | Ht 64.0 in | Wt 232.4 lb

## 2023-04-13 DIAGNOSIS — F5102 Adjustment insomnia: Secondary | ICD-10-CM | POA: Diagnosis not present

## 2023-04-13 DIAGNOSIS — E890 Postprocedural hypothyroidism: Secondary | ICD-10-CM | POA: Diagnosis not present

## 2023-04-13 DIAGNOSIS — Z6839 Body mass index (BMI) 39.0-39.9, adult: Secondary | ICD-10-CM

## 2023-04-13 DIAGNOSIS — I10 Essential (primary) hypertension: Secondary | ICD-10-CM

## 2023-04-13 DIAGNOSIS — E669 Obesity, unspecified: Secondary | ICD-10-CM

## 2023-04-13 LAB — COMPREHENSIVE METABOLIC PANEL
ALT: 20 U/L (ref 0–35)
AST: 18 U/L (ref 0–37)
Albumin: 4.5 g/dL (ref 3.5–5.2)
Alkaline Phosphatase: 73 U/L (ref 39–117)
BUN: 15 mg/dL (ref 6–23)
CO2: 24 mEq/L (ref 19–32)
Calcium: 10 mg/dL (ref 8.4–10.5)
Chloride: 98 mEq/L (ref 96–112)
Creatinine, Ser: 0.79 mg/dL (ref 0.40–1.20)
GFR: 85.12 mL/min (ref >60.00)
Glucose, Bld: 100 mg/dL — ABNORMAL HIGH (ref 70–99)
Potassium: 3.9 mEq/L (ref 3.5–5.1)
Sodium: 134 mEq/L — ABNORMAL LOW (ref 135–145)
Total Bilirubin: 0.4 mg/dL (ref 0.2–1.2)
Total Protein: 7.4 g/dL (ref 6.0–8.3)

## 2023-04-13 LAB — TSH: TSH: 1.66 u[IU]/mL (ref 0.35–5.50)

## 2023-04-13 MED ORDER — MONTELUKAST SODIUM 10 MG PO TABS: 10.0000 mg | ORAL_TABLET | Freq: Every day | ORAL | 3 refills | Status: DC

## 2023-04-13 MED ORDER — ALPRAZOLAM 0.5 MG PO TABS: 0.2500 mg | ORAL_TABLET | Freq: Every day | ORAL | 2 refills | Status: DC | PRN

## 2023-04-13 NOTE — Progress Notes (Signed)
Subjective  CC:  Chief Complaint  Patient presents with   Hypertension    HPI: Angelica Lester is a 52 y.o. female who presents to the office today to address the problems listed above in the chief complaint. Hypertension f/u: Control is  variable . Pt reports she is doing well however she reports 2 separate occasions where she stood up and felt very lightheaded.  She checked her blood pressure, 90s over 50s on those occasions.  Uncertain etiologies.  She had eaten well that day, thinks she was hydrated.  Of note she did step outside when it happened at work and it was extremely hot and humid out.  That could have been a factor.  She has been checking her blood pressure regularly over the last 2 weeks and it tends to run 120s to 130s over 70s.  However she had 2 lows and occasional high in the morning before she takes her blood pressure pill.  She denies chest pain, palpitations, dyspnea on exertion or lower extremity edema.  Compliance with medication is good.  Her blood pressure is mildly elevated upon arrival today, I rechecked left arm seated position and it improved.  Diastolic remains mildly elevated at 82.  Her weight continues to trend downward.  She continues to try to eat well but exercise has been limited due to the heat. Hypothyroidism: Taking medications and feels like this is well-controlled. Adjustment insomnia: Mainly stress related.  Uses Xanax as needed.  Request refill.  Assessment  1. Essential hypertension   2. Postoperative hypothyroidism   3. Adjustment insomnia   4. Obesity (BMI 30-39.9)      Plan   Hypertension f/u: BP control is fairly well controlled.  Will monitor blood pressures, keep hydrated and if further problems with low blood pressures recurs, I can adjust medications.  However given her home readings her blood pressure is well-managed on her current dose of medications.  Patient understands and agrees.  Recheck renal function and electrolytes today Check  TSH today to ensure this is stable given ongoing weight loss. Refilled Xanax, uses appropriately and intermittently. Obesity: Continue healthy eating and steady weight loss.  She will start walking again this fall.  Education regarding management of these chronic disease states was given. Management strategies discussed on successive visits include dietary and exercise recommendations, goals of achieving and maintaining IBW, and lifestyle modifications aiming for adequate sleep and minimizing stressors.   Follow up: 6 months for complete physical and recheck.  She will follow-up with me sooner if she has further blood pressure concerns  Orders Placed This Encounter  Procedures   TSH   Comprehensive metabolic panel   Meds ordered this encounter  Medications   ALPRAZolam (XANAX) 0.5 MG tablet    Sig: Take 0.5-1 tablets (0.25-0.5 mg total) by mouth daily as needed for anxiety.    Dispense:  30 tablet    Refill:  2   montelukast (SINGULAIR) 10 MG tablet    Sig: Take 1 tablet (10 mg total) by mouth daily.    Dispense:  90 tablet    Refill:  3      BP Readings from Last 3 Encounters:  04/13/23 124/82  02/24/23 103/71  11/23/22 116/86   Wt Readings from Last 3 Encounters:  04/13/23 232 lb 6.4 oz (105.4 kg)  02/24/23 232 lb 12.8 oz (105.6 kg)  11/23/22 237 lb (107.5 kg)    Lab Results  Component Value Date   CHOL 216 (H) 10/13/2022  CHOL 207 (H) 10/24/2021   CHOL 203 (H) 10/17/2020   Lab Results  Component Value Date   HDL 59.80 10/13/2022   HDL 56.70 10/24/2021   HDL 51.40 10/17/2020   Lab Results  Component Value Date   LDLCALC 119 (H) 10/13/2022   LDLCALC 126 (H) 10/24/2021   LDLCALC 126 (H) 10/17/2020   Lab Results  Component Value Date   TRIG 187.0 (H) 10/13/2022   TRIG 118.0 10/24/2021   TRIG 127.0 10/17/2020   Lab Results  Component Value Date   CHOLHDL 4 10/13/2022   CHOLHDL 4 10/24/2021   CHOLHDL 4 10/17/2020   Lab Results  Component Value Date    LDLDIRECT 137.0 10/01/2017   Lab Results  Component Value Date   CREATININE 0.75 10/13/2022   BUN 16 10/13/2022   NA 138 10/13/2022   K 4.0 10/13/2022   CL 100 10/13/2022   CO2 24 10/13/2022    The 10-year ASCVD risk score (Arnett DK, et al., 2019) is: 2.1%   Values used to calculate the score:     Age: 89 years     Sex: Female     Is Non-Hispanic African American: No     Diabetic: No     Tobacco smoker: No     Systolic Blood Pressure: 124 mmHg     Is BP treated: Yes     HDL Cholesterol: 59.8 mg/dL     Total Cholesterol: 216 mg/dL  I reviewed the patients updated PMH, FH, and SocHx.    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 She has never used smokeless tobacco. She reports current alcohol use of about 3.0 standard drinks of alcohol per week. She reports that she does not use drugs.  Current Meds  Medication Sig   albuterol (VENTOLIN HFA) 108 (90 Base) MCG/ACT inhaler Inhale 2 puffs into the lungs every 6 (six) hours as needed for wheezing or shortness  of breath.   diclofenac (VOLTAREN) 75 MG EC tablet Take 1 tablet (75 mg total) by mouth 2 (two) times daily as needed.   dicyclomine (BENTYL) 10 MG capsule Take 1 capsule (10 mg total) by mouth 3 (three) times daily before meals. As needed   fluticasone (FLONASE) 50 MCG/ACT nasal spray USE 1 SPRAY INTO BOTH NOSTRILS DAILY   hydrochlorothiazide (MICROZIDE) 12.5 MG capsule Take 1 capsule (12.5 mg total) by mouth daily.   levothyroxine (SYNTHROID) 150 MCG tablet Take 1 tablet (150 mcg  total) by mouth daily.   lisinopril (ZESTRIL) 40 MG tablet Take 1 tablet (40 mg total) by mouth daily.   valACYclovir (VALTREX) 1000 MG tablet Take 2 tablets (2,000 mg total) by mouth 2 (two) times daily. Once, as needed for cold sores   [DISCONTINUED] ALPRAZolam (XANAX) 0.5 MG tablet Take 0.5-1 tablets (0.25-0.5 mg total) by mouth daily as needed for anxiety.   [DISCONTINUED] montelukast (SINGULAIR) 10 MG tablet Take 1 tablet (10 mg total) by mouth daily.    Review of Systems: Cardiovascular: negative for chest pain, palpitations, leg swelling, orthopnea Respiratory: negative for SOB, wheezing or persistent cough Gastrointestinal: negative for abdominal pain Genitourinary: negative for dysuria or gross hematuria  Objective  Vitals: BP 124/82   Pulse 78   Temp 98.3 F (36.8 C)   Ht 5\' 4"  (1.626 m)   Wt 232 lb 6.4 oz (105.4 kg)   LMP 06/04/2019   SpO2 96%   BMI 39.89 kg/m  General: no acute distress  Psych:  Alert and oriented, normal mood and affect HEENT:  Normocephalic, atraumatic, supple neck  Cardiovascular:  RRR without murmur. no edema Respiratory:  Good breath sounds bilaterally, CTAB with normal respiratory effort Skin:  Warm, no rashes Neurologic:   Mental status is normal Commons side effects, risks, benefits, and alternatives for medications and treatment plan prescribed today were discussed, and the patient expressed understanding of the given instructions. Patient is instructed to call or  message via MyChart if he/she has any questions or concerns regarding our treatment plan. No barriers to understanding were identified. We discussed Red Flag symptoms and signs in detail. Patient expressed understanding regarding what to do in case of urgent or emergency type symptoms.  Medication list was reconciled, printed and provided to the patient in AVS. Patient instructions and summary information was reviewed with the patient as documented in the AVS. This note was prepared with assistance of Dragon voice recognition software. Occasional wrong-word or sound-a-like substitutions may have occurred due to the inherent limitation

## 2023-04-14 NOTE — Progress Notes (Signed)
See my chart note.

## 2023-05-12 ENCOUNTER — Ambulatory Visit: Payer: BC Managed Care – PPO | Admitting: Family Medicine

## 2023-05-12 VITALS — BP 113/78 | HR 66 | Temp 98.6°F | Resp 18 | Ht 64.0 in | Wt 236.5 lb

## 2023-05-12 DIAGNOSIS — J0101 Acute recurrent maxillary sinusitis: Secondary | ICD-10-CM | POA: Diagnosis not present

## 2023-05-12 MED ORDER — AZITHROMYCIN 250 MG PO TABS: ORAL_TABLET | ORAL | 0 refills | Status: AC

## 2023-05-12 MED ORDER — PREDNISONE 20 MG PO TABS: 40.0000 mg | ORAL_TABLET | Freq: Every day | ORAL | 0 refills | Status: AC

## 2023-05-12 NOTE — Progress Notes (Signed)
Subjective:     Patient ID: Angelica Lester, female    DOB: [birth year], 52 y.o.   MRN: [id number]  Chief Complaint  Patient presents with   Sinusitis    Nasal congestion with yellow mucus, pain and pressure in left side of nose that started 1 1/2 weeks ago Has been using flonase, sudafed, allergy medication and saline nasal spray   Pain    Pain in left shoulder blade that started 2 or 3 days ago   Cough    HPI Nasal Congestion and Cough - Complains of nasal congestion and sinus pressure on the left side of her face that began one week ago =. States that usually her sinus infections are on the left side of her face twice a year as the seasons change. Endorses post nasal drip, pain and pressure on her face, and a cough that began today. Has taken Flonase, saline spray, and OTC allergy medication with little to no relief.   Pain Between Shoulder Blades - Pt states for the past 2-3 days she has begun feeling back pain in-between her shoulder blades on L. Denies any worsening of pain when walking or taking a deep breath. Throughout the day, the pain can range from dull to stabbing.   There are no preventive care reminders to display for this patient.   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 Juleen China, endocrine   PONV (postoperative nausea and vomiting)    Skin cancer    "right arm" (10/19/2016)    Past Surgical History:  Procedure Laterality Date   BREAST SURGERY  12/14/2017   Reduction   DILATION AND CURETTAGE OF UTERUS  X 1   LAPAROSCOPIC CHOLECYSTECTOMY  2005   MOHS SURGERY Right    arm   THYROIDECTOMY N/A 10/19/2016    Procedure: TOTAL THYROIDECTOMY;  Surgeon: Darnell Level, MD;  Location: [facility] OR;  Service: General;  Laterality: N/A;   TOTAL THYROIDECTOMY  10/19/2016     Current Outpatient Medications:    albuterol (VENTOLIN HFA) 108 (90 Base) MCG/ACT inhaler, Inhale 2 puffs into the lungs every 6 (six) hours as needed for wheezing or shortness of breath., Disp: 1 each, Rfl: 2   ALPRAZolam (XANAX) 0.5 MG tablet, Take 0.5-1 tablets (0.25-0.5 mg total) by mouth daily as needed for anxiety., Disp: 30 tablet, Rfl: 2   azithromycin (ZITHROMAX) 250 MG tablet, Take 2 tablets on day 1, then 1 tablet daily on days 2 through 5, Disp: 6 tablet, Rfl: 0   diclofenac (VOLTAREN) 75 MG EC tablet, Take 1 tablet (75 mg total) by mouth 2 (two) times daily as needed., Disp: 60 tablet, Rfl: 0   dicyclomine (BENTYL) 10 MG capsule, Take 1 capsule (10 mg total) by mouth 3 (three) times daily before meals. As needed, Disp: 90 capsule, Rfl: 1   fluticasone (FLONASE) 50 MCG/ACT nasal spray, USE 1 SPRAY INTO BOTH NOSTRILS DAILY, Disp: 16 g, Rfl: 5   hydrochlorothiazide (MICROZIDE) 12.5 MG capsule, Take 1 capsule (12.5 mg total) by mouth daily., Disp: 90 capsule, Rfl:  3   levothyroxine (SYNTHROID) 150 MCG tablet, Take 1 tablet (150 mcg total) by mouth daily., Disp: 90 tablet, Rfl: 3   lisinopril (ZESTRIL) 40 MG tablet, Take 1 tablet (40 mg total) by mouth daily., Disp: 90 tablet, Rfl: 3   montelukast (SINGULAIR) 10 MG tablet, Take 1 tablet (10 mg total) by mouth daily., Disp: 90 tablet, Rfl: 3   predniSONE (DELTASONE) 20 MG tablet, Take 2 tablets (40 mg total) by mouth daily with breakfast for 5 days., Disp: 10 tablet, Rfl: 0   valACYclovir (VALTREX) 1000 MG tablet, Take 2 tablets (2,000 mg total) by mouth 2 (two) times daily. Once, as needed for cold sores, Disp: 20 tablet, Rfl: 2  Allergies  Allergen Reactions   Fexofenadine Other (See Comments)    myalgias myalgias    Amoxicillin Nausea Only   ROS neg/noncontributory except as noted  HPI/below    Objective:     BP 113/78   Pulse 66   Temp 98.6 F (37 C) (Temporal)   Resp 18   Ht 5\' 4"  (1.626 m)   Wt 236 lb 8 oz (107.3 kg)   LMP 06/04/2019   SpO2 96%   BMI 40.60 kg/m  Wt Readings from Last 3 Encounters:  05/12/23 236 lb 8 oz (107.3 kg)  04/13/23 232 lb 6.4 oz (105.4 kg)  02/24/23 232 lb 12.8 oz (105.6 kg)    Physical Exam   Gen: WDWN NAD HEENT: NCAT, conjunctiva not injected, sclera nonicteric TM WNL B, OP moist, no exudates+ left sinuses tender NECK:  supple, no thyromegaly, no nodes, no carotid bruits +tightness on left side of neck CARDIAC: RRR, S1S2+, no murmur.  LUNGS: CTAB. No wheezes MSK: no gross abnormalities. +tenderness medial border scapula on the left  NEURO: A&O x3.  CN II-XII intact.  PSYCH: normal mood. Good eye contact     Assessment & Plan:  Acute recurrent maxillary sinusitis  Other orders -     Azithromycin; Take 2 tablets on day 1, then 1 tablet daily on days 2 through 5  Dispense: 6 tablet; Refill: 0 -     predniSONE; Take 2 tablets (40 mg total) by mouth daily with breakfast for 5 days.  Dispense: 10 tablet; Refill: 0  1.  Left maxillary/frontal sinusitis.,  But gets about twice a year.  Will do Z-Pak and prednisone 40 mg daily as this is usually what works for her 2.  Left shoulder pain-pulled muscle, due to illness, other.  Doubt pleurisy.  Should help.  Things worsen, do not improve, let us know No follow-ups on file.  Germaine Pomfret Rice,acting as a scribe for Angelena Sole, MD.,have documented all relevant documentation on the behalf of Angelena Sole, MD,as directed by  Angelena Sole, MD while in the presence of Angelena Sole, MD.  I, Angelena Sole, MD, have reviewed all documentation for this visit. The documentation on 05/12/23 for the exam, diagnosis, procedures, and orders are all accurate and complete.   Angelena Sole, MD

## 2023-05-17 ENCOUNTER — Encounter: Payer: Self-pay | Admitting: Family Medicine

## 2023-05-19 MED ORDER — LEVOTHYROXINE SODIUM 150 MCG PO TABS: 150.0000 ug | ORAL_TABLET | Freq: Every day | ORAL | 3 refills | Status: DC

## 2023-10-18 ENCOUNTER — Encounter: Payer: BC Managed Care – PPO | Admitting: Family Medicine

## 2023-10-20 ENCOUNTER — Ambulatory Visit: Payer: BC Managed Care – PPO | Admitting: Family Medicine

## 2023-10-20 ENCOUNTER — Encounter: Payer: Self-pay | Admitting: Family Medicine

## 2023-10-20 VITALS — BP 117/82 | HR 78 | Temp 97.7°F | Ht 64.0 in | Wt 233.6 lb

## 2023-10-20 DIAGNOSIS — J302 Other seasonal allergic rhinitis: Secondary | ICD-10-CM | POA: Diagnosis not present

## 2023-10-20 DIAGNOSIS — F5102 Adjustment insomnia: Secondary | ICD-10-CM

## 2023-10-20 DIAGNOSIS — M545 Low back pain, unspecified: Secondary | ICD-10-CM

## 2023-10-20 DIAGNOSIS — K760 Fatty (change of) liver, not elsewhere classified: Secondary | ICD-10-CM

## 2023-10-20 DIAGNOSIS — E890 Postprocedural hypothyroidism: Secondary | ICD-10-CM | POA: Diagnosis not present

## 2023-10-20 DIAGNOSIS — Z23 Encounter for immunization: Secondary | ICD-10-CM

## 2023-10-20 DIAGNOSIS — I10 Essential (primary) hypertension: Secondary | ICD-10-CM | POA: Diagnosis not present

## 2023-10-20 DIAGNOSIS — Z0001 Encounter for general adult medical examination with abnormal findings: Secondary | ICD-10-CM | POA: Diagnosis not present

## 2023-10-20 DIAGNOSIS — L719 Rosacea, unspecified: Secondary | ICD-10-CM | POA: Insufficient documentation

## 2023-10-20 LAB — COMPREHENSIVE METABOLIC PANEL
ALT: 23 U/L (ref 0–35)
AST: 20 U/L (ref 0–37)
Albumin: 4.4 g/dL (ref 3.5–5.2)
Alkaline Phosphatase: 69 U/L (ref 39–117)
BUN: 18 mg/dL (ref 6–23)
CO2: 26 meq/L (ref 19–32)
Calcium: 10.1 mg/dL (ref 8.4–10.5)
Chloride: 99 meq/L (ref 96–112)
Creatinine, Ser: 0.8 mg/dL (ref 0.40–1.20)
GFR: 83.54 mL/min (ref >60.00)
Glucose, Bld: 105 mg/dL — ABNORMAL HIGH (ref 70–99)
Potassium: 3.9 meq/L (ref 3.5–5.1)
Sodium: 135 meq/L (ref 135–145)
Total Bilirubin: 0.5 mg/dL (ref 0.2–1.2)
Total Protein: 7.2 g/dL (ref 6.0–8.3)

## 2023-10-20 LAB — LIPID PANEL
Cholesterol: 210 mg/dL — ABNORMAL HIGH (ref 0–200)
HDL: 57.5 mg/dL (ref >39.00)
LDL Cholesterol: 123 mg/dL — ABNORMAL HIGH (ref 0–99)
NonHDL: 152.21
Total CHOL/HDL Ratio: 4
Triglycerides: 148 mg/dL (ref 0.0–149.0)
VLDL: 29.6 mg/dL (ref 0.0–40.0)

## 2023-10-20 LAB — CBC WITH DIFFERENTIAL/PLATELET
Basophils Absolute: 0 10*3/uL (ref 0.0–0.1)
Basophils Relative: 0.8 % (ref 0.0–3.0)
Eosinophils Absolute: 0.2 10*3/uL (ref 0.0–0.7)
Eosinophils Relative: 3 % (ref 0.0–5.0)
HCT: 42.1 % (ref 36.0–46.0)
Hemoglobin: 14.1 g/dL (ref 12.0–15.0)
Lymphocytes Relative: 22.6 % (ref 12.0–46.0)
Lymphs Abs: 1.4 10*3/uL (ref 0.7–4.0)
MCHC: 33.6 g/dL (ref 30.0–36.0)
MCV: 90.7 fL (ref 78.0–100.0)
Monocytes Absolute: 0.4 10*3/uL (ref 0.1–1.0)
Monocytes Relative: 6.4 % (ref 3.0–12.0)
Neutro Abs: 4.2 10*3/uL (ref 1.4–7.7)
Neutrophils Relative %: 67.2 % (ref 43.0–77.0)
Platelets: 285 10*3/uL (ref 150.0–400.0)
RBC: 4.64 Mil/uL (ref 3.87–5.11)
RDW: 13.8 % (ref 11.5–15.5)
WBC: 6.2 10*3/uL (ref 4.0–10.5)

## 2023-10-20 LAB — TSH: TSH: 1.75 u[IU]/mL (ref 0.35–5.50)

## 2023-10-20 MED ORDER — CYCLOBENZAPRINE HCL 10 MG PO TABS: 10.0000 mg | ORAL_TABLET | Freq: Every evening | ORAL | 0 refills | Status: AC | PRN

## 2023-10-20 MED ORDER — VALACYCLOVIR HCL 1 G PO TABS: 2000.0000 mg | ORAL_TABLET | Freq: Two times a day (BID) | ORAL | 2 refills | Status: AC

## 2023-10-20 MED ORDER — DICLOFENAC SODIUM 75 MG PO TBEC: 75.0000 mg | DELAYED_RELEASE_TABLET | Freq: Two times a day (BID) | ORAL | 0 refills | Status: AC | PRN

## 2023-10-20 MED ORDER — ALPRAZOLAM 0.5 MG PO TABS: 0.2500 mg | ORAL_TABLET | Freq: Every day | ORAL | 2 refills | Status: DC | PRN

## 2023-10-20 MED ORDER — LISINOPRIL 40 MG PO TABS: 40.0000 mg | ORAL_TABLET | Freq: Every day | ORAL | 3 refills | Status: AC

## 2023-10-20 MED ORDER — HYDROCHLOROTHIAZIDE 12.5 MG PO CAPS: 12.5000 mg | ORAL_CAPSULE | Freq: Every day | ORAL | 3 refills | Status: AC

## 2023-10-20 MED ORDER — AZELASTINE HCL 0.1 % NA SOLN: 1.0000 | Freq: Two times a day (BID) | NASAL | 5 refills | Status: AC

## 2023-10-20 NOTE — Patient Instructions (Signed)
 Please return in 3-6 months for weight loss and bp recheck.   I will release your lab results to you on your MyChart account with further instructions. You may see the results before I do, but when I review them I will send you a message with my report or have my assistant call you if things need to be discussed. Please reply to my message with any questions. Thank you!   If you have any questions or concerns, please don't hesitate to send me a message via MyChart or call the office at [phone]. Thank you for visiting with [place] today! It's our pleasure caring for you.   Back Exercises The following exercises strengthen the muscles that help to support the trunk (torso) and back. They also help to keep the lower back flexible. Doing these exercises can help to prevent or lessen existing low back pain. If you have back pain or discomfort, try doing these exercises 2-3 times each day or as told by your health care provider. As your pain improves, do them once each day, but increase the number of times that you repeat the steps for each exercise (do more repetitions). To prevent the recurrence of back pain, continue to do these exercises once each day or as told by your health care provider. Do exercises exactly as told by your health care provider and adjust them as directed. It is normal to feel mild stretching, pulling, tightness, or discomfort as you do these exercises, but you should stop right away if you feel sudden pain or your pain gets worse. Exercises Single knee to chest Repeat these steps 3-5 times for each leg: Lie on your back on a firm bed or the floor with your legs extended. Bring one knee to your chest. Your other leg should stay extended and in contact with the floor. Hold your knee in place by grabbing your knee or thigh with both hands and hold. Pull on your knee until you feel a gentle stretch in your lower back or buttocks. Hold the stretch for 10-30 seconds. Slowly release  and straighten your leg.  Pelvic tilt Repeat these steps 5-10 times: Lie on your back on a firm bed or the floor with your legs extended. Bend your knees so they are pointing toward the ceiling and your feet are flat on the floor. Tighten your lower abdominal muscles to press your lower back against the floor. This motion will tilt your pelvis so your tailbone points up toward the ceiling instead of pointing to your feet or the floor. With gentle tension and even breathing, hold this position for 5-10 seconds.  Cat-cow Repeat these steps until your lower back becomes more flexible: Get into a hands-and-knees position on a firm bed or the floor. Keep your hands under your shoulders, and keep your knees under your hips. You may place padding under your knees for comfort. Let your head hang down toward your chest. Contract your abdominal muscles and point your tailbone toward the floor so your lower back becomes rounded like the back of a cat. Hold this position for 5 seconds. Slowly lift your head, let your abdominal muscles relax, and point your tailbone up toward the ceiling so your back forms a sagging arch like the back of a cow. Hold this position for 5 seconds.  Press-ups Repeat these steps 5-10 times: Lie on your abdomen (face-down) on a firm bed or the floor. Place your palms near your head, about shoulder-width apart. Keeping your back  as relaxed as possible and keeping your hips on the floor, slowly straighten your arms to raise the top half of your body and lift your shoulders. Do not use your back muscles to raise your upper torso. You may adjust the placement of your hands to make yourself more comfortable. Hold this position for 5 seconds while you keep your back relaxed. Slowly return to lying flat on the floor.  Bridges Repeat these steps 10 times: Lie on your back on a firm bed or the floor. Bend your knees so they are pointing toward the ceiling and your feet are flat on  the floor. Your arms should be flat at your sides, next to your body. Tighten your buttocks muscles and lift your buttocks off the floor until your waist is at almost the same height as your knees. You should feel the muscles working in your buttocks and the back of your thighs. If you do not feel these muscles, slide your feet 1-2 inches (2.5-5 cm) farther away from your buttocks. Hold this position for 3-5 seconds. Slowly lower your hips to the starting position, and allow your buttocks muscles to relax completely. If this exercise is too easy, try doing it with your arms crossed over your chest. Abdominal crunches Repeat these steps 5-10 times: Lie on your back on a firm bed or the floor with your legs extended. Bend your knees so they are pointing toward the ceiling and your feet are flat on the floor. Cross your arms over your chest. Tip your chin slightly toward your chest without bending your neck. Tighten your abdominal muscles and slowly raise your torso high enough to lift your shoulder blades a tiny bit off the floor. Avoid raising your torso higher than that because it can put too much stress on your lower back and does not help to strengthen your abdominal muscles. Slowly return to your starting position.  Back lifts Repeat these steps 5-10 times: Lie on your abdomen (face-down) with your arms at your sides, and rest your forehead on the floor. Tighten the muscles in your legs and your buttocks. Slowly lift your chest off the floor while you keep your hips pressed to the floor. Keep the back of your head in line with the curve in your back. Your eyes should be looking at the floor. Hold this position for 3-5 seconds. Slowly return to your starting position.  Contact a health care provider if: Your back pain or discomfort gets much worse when you do an exercise. Your worsening back pain or discomfort does not lessen within 2 hours after you exercise. If you have any of these  problems, stop doing these exercises right away. Do not do them again unless your health care provider says that you can. Get help right away if: You develop sudden, severe back pain. If this happens, stop doing the exercises right away. Do not do them again unless your health care provider says that you can. This information is not intended to replace advice given to you by your health care provider. Make sure you discuss any questions you have with your health care provider. Document Revised: 09/27/2022 Document Reviewed: 11/06/2020 Elsevier Patient Education  2024 [employer].

## 2023-10-20 NOTE — Progress Notes (Signed)
Subjective  Chief Complaint  Patient presents with   Annual Exam    Pt here for Annual Exam and is is currently fasting    Hypertension    HPI: Angelica Lester is a 53 y.o. female who presents to [facility] [place] today for a Female Wellness Visit. She also has the concerns and/or needs as listed above in the chief complaint. These will be addressed in addition to the Health Maintenance Visit.   Wellness Visit: annual visit with health maintenance review and exam  Health maintenance: All screens are up-to-date.  Mammogram normal.  Pap smear normal last year.  Colon cancer screens, last done with normal in 2020.  Continues to try to eat healthy.  Weight loss has been maintained but she would like to lose more.  Immunizations are up-to-date.  Eye exam is up-to-date.  Feeling well overall Chronic disease f/u and/or acute problem visit: (deemed necessary to be done in addition to the wellness visit): Complains of low back pain.  Has intermittent low back pain but has been doing a lot of painting and lifting and cleaning in her home.  Feels stiffness in the morning.  Sometimes cannot get comfortable at night.  Occasional muscle spasms.  No radicular symptoms or bowel or bladder problems.  No injury.  Using over-the-counter NSAIDs with mild relief. Hypertension is well-controlled on her current medications.  No chest pain or shortness of breath.  No edema.  Needs refills Postoperative hypothyroidism is due for recheck.  She is now getting brand-name Synthroid.  Energy levels are good.  No symptoms or high or low thyroid Adjustment insomnia and occasional anxiety are well treated with Xanax on a very as needed basis.  No mood problems Allergies are active, PND is present.  Some sneezing.  She takes Allegra and intermittent use of Flonase  Assessment  1. Encounter for well adult exam with abnormal findings   2. Need for influenza vaccination   3. Essential hypertension   4.  Postoperative hypothyroidism   5. Adjustment insomnia   6. Hepatic steatosis   7. Obesity, morbid (HCC) Chronic  8. Low back pain, episodic   9. Seasonal allergies      Plan  Female Wellness Visit: Age appropriate Health Maintenance and Prevention measures were discussed with patient. Included topics are cancer screening recommendations, ways to keep healthy (see AVS) including dietary and exercise recommendations, regular eye and dental care, use of seat belts, and avoidance of moderate alcohol use and tobacco use.  Screens are current BMI: discussed patient's BMI and encouraged positive lifestyle modifications to help get to or maintain a target BMI. HM needs and immunizations were addressed and ordered. See below for orders. See HM and immunization section for updates. Routine labs and screening tests ordered including cmp, cbc and lipids where appropriate. Discussed recommendations regarding Vit D and calcium supplementation (see AVS)  Chronic disease management visit and/or acute problem visit: Episodic low back pain, strain: Heat stretches, see after visit summary for exercises to be done at home.  Patient defers formal physical therapy at this time.  2-week course of diclofenac 75 mg twice a day.  Muscle relaxer at night as needed.  Follow-up if not improving.  No red flag symptoms present Hypertension is well-controlled.  Continue lisinopril and HCTZ.  Monitor renal function electrolytes today Morbid obesity: Discussed diet exercise and possible use of GLP-1's.  Patient will consider. Seasonal allergies: Changed to Zyrtec at night and add Astelin nasal spray  Monitoring LFTs due to hepatic steatosis.  Low-fat diet recommended Xanax for adjustment insomnia refilled.  Follow up: 3 to 6 months to recheck blood pressure and/or weight Orders Placed This Encounter  Procedures   Flu vaccine trivalent PF, 6mos and older(Flulaval,Afluria,Fluarix,Fluzone)   CBC with Differential/Platelet    Comprehensive metabolic panel   Lipid panel   TSH   Meds ordered this encounter  Medications   lisinopril (ZESTRIL) 40 MG tablet    Sig: Take 1 tablet (40 mg total) by mouth daily.    Dispense:  90 tablet    Refill:  3   hydrochlorothiazide (MICROZIDE) 12.5 MG capsule    Sig: Take 1 capsule (12.5 mg total) by mouth daily.    Dispense:  90 capsule    Refill:  3   valACYclovir (VALTREX) 1000 MG tablet    Sig: Take 2 tablets (2,000 mg total) by mouth 2 (two) times daily. Once, as needed for cold sores    Dispense:  20 tablet    Refill:  2   ALPRAZolam (XANAX) 0.5 MG tablet    Sig: Take 0.5-1 tablets (0.25-0.5 mg total) by mouth daily as needed for anxiety.    Dispense:  30 tablet    Refill:  2   azelastine (ASTELIN) 0.1 % nasal spray    Sig: Place 1 spray into both nostrils 2 (two) times daily.    Dispense:  30 mL    Refill:  5   cyclobenzaprine (FLEXERIL) 10 MG tablet    Sig: Take 1 tablet (10 mg total) by mouth at bedtime as needed for muscle spasms.    Dispense:  30 tablet    Refill:  0   diclofenac (VOLTAREN) 75 MG EC tablet    Sig: Take 1 tablet (75 mg total) by mouth 2 (two) times daily as needed.    Dispense:  60 tablet    Refill:  0      Body mass index is 40.1 kg/m. Wt Readings from Last 3 Encounters:  10/20/23 233 lb 9.6 oz (106 kg)  05/12/23 236 lb 8 oz (107.3 kg)  04/13/23 232 lb 6.4 oz (105.4 kg)     Patient Active Problem List   Diagnosis Date Noted Date Diagnosed   Obesity, morbid (HCC) 10/20/2023     Priority: High   Essential hypertension 03/12/2017     Priority: High   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     Priority: High    Overview:  Total thyroidectomy 10/2016 for multiple nodules and goiter    Obesity (BMI 30-39.9) 03/09/2016     Priority: High   Family history of colon cancer 09/27/2013     Priority: High   Hepatic steatosis 10/23/2020     Priority: Medium     With elevated liver tests: by ultrasound 10/2020    Adjustment  insomnia 01/05/2019     Priority: Medium     Prn xanax    GERD (gastroesophageal reflux disease) 09/27/2013     Priority: Medium    Irritable bowel syndrome 08/28/2010     Priority: Medium     Overview:  Constipation predominant    Migraine headache 02/24/2010     Priority: Medium    Rosacea 10/20/2023     Priority: Low   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     Priority: Low   Health Maintenance  Topic Date Due   COVID-19 Vaccine (4 - 2024-25 season) 11/05/2023 (Originally 05/09/2023)   INFLUENZA VACCINE  12/06/2023 (Originally 04/08/2023)   MAMMOGRAM  03/07/2024   Cervical Cancer Screening (HPV/Pap Cotest)  01/08/2027   DTaP/Tdap/Td (4 - Td or Tdap) 05/18/2029   Colonoscopy  06/27/2029   Hepatitis C Screening  Completed   HIV Screening  Completed   Zoster Vaccines- Shingrix  Completed   HPV VACCINES  Aged Out   Immunization History  Administered Date(s) Administered   Influenza Split 06/25/2011   Influenza, Seasonal, Injecte, Preservative Fre 07/09/2015   Influenza,inj,Quad PF,6+ Mos 06/21/2014, 05/19/2019, 06/12/2019, 07/22/2022   Influenza-Unspecified 06/07/2017, 09/13/2020   PFIZER(Purple Top)SARS-COV-2 Vaccination 11/22/2019, 12/06/2019, 09/13/2020   Tdap 09/08/2003, 03/08/2012, 05/19/2019   Zoster Recombinant(Shingrix) 06/12/2019, 09/27/2019   We updated and reviewed the patient's past history in detail and it is documented below. Allergies: Patient is allergic to fexofenadine and amoxicillin. Past Medical History Patient  has a past medical history of Allergy, Colon polyps, Complication of anesthesia, Ectopic pregnancy, GERD (gastroesophageal reflux  disease), History of thyroid nodule, Hyperlipidemia, Hypertension, Hypothyroidism, Migraine, Multinodular goiter (nontoxic) (09/27/2013), PONV (postoperative nausea and vomiting), and Skin cancer. Past Surgical History Patient  has a past surgical history that includes Total thyroidectomy (10/19/2016); Mohs surgery (Right); Dilation and curettage of uterus (X 1); Thyroidectomy (N/A, 10/19/2016); Laparoscopic cholecystectomy (2005); and Breast surgery (12/14/2017). Family History: Patient family history includes Alzheimer's disease in her paternal grandmother; Atrial fibrillation in her father and mother; COPD in her mother; Colon cancer in her paternal grandfather; Heart disease in her sister; Hyperlipidemia in her mother; Hypertension in her mother; Liver disease in her sister. Social History:  Patient  reports that she has never smoked. She has never used smokeless tobacco. She reports current alcohol use of about 3.0 standard drinks of alcohol per week. She reports that she does not use drugs.  Review of Systems: Constitutional: negative for fever or malaise Ophthalmic: negative for photophobia, double vision or loss of vision Cardiovascular: negative for chest pain, dyspnea on exertion, or new LE swelling Respiratory: negative for SOB or persistent cough Gastrointestinal: negative for abdominal pain, change in bowel habits or melena Genitourinary: negative for dysuria or gross hematuria, no abnormal uterine bleeding or disharge Musculoskeletal: negative for new gait disturbance or muscular weakness Integumentary: negative for new or persistent rashes, no breast lumps Neurological: negative for TIA or stroke symptoms Psychiatric: negative for SI or delusions Allergic/Immunologic: negative for hives  Patient Care Team    Relationship Specialty Notifications Start End  Willow Ora, MD PCP - General Family Medicine  10/12/16   Marlene Lard, MD Consulting Physician Endocrinology  10/01/17    Elmon Else, MD Consulting Physician Dermatology  10/17/20   Rosalyn Gess, MD Consulting Physician Obstetrics and Gynecology  10/20/23     Objective  Vitals: BP 117/82   Pulse 78   Temp 97.7 F (36.5 C)   Ht 5\' 4"  (1.626 m)   Wt 233 lb 9.6 oz (106 kg)   LMP 06/04/2019   SpO2 96%   BMI 40.10 kg/m  General:  Well developed, well nourished, no acute distress  Psych:  Alert and orientedx3,normal mood and affect HEENT:  Normocephalic, atraumatic, non-icteric sclera,  supple neck without adenopathy, mass  or thyromegaly Cardiovascular:  Normal S1, S2, RRR without gallop, rub or murmur Respiratory:  Good breath sounds bilaterally, CTAB with normal respiratory effort Gastrointestinal: normal bowel sounds, soft, non-tender, no noted masses. No HSM MSK: extremities without edema, joints without erythema or swelling Neurologic:    Mental status is normal.  Gross motor and sensory exams are normal.  No tremor  Commons side effects, risks, benefits, and alternatives for medications and treatment plan prescribed today were discussed, and the patient expressed understanding of the given instructions. Patient is instructed to call or message via MyChart if he/she has any questions or concerns regarding our treatment plan. No barriers to understanding were identified. We discussed Red Flag symptoms and signs in detail. Patient expressed understanding regarding what to do in case of urgent or emergency type symptoms.  Medication list was reconciled, printed and provided to the patient in AVS. Patient instructions and summary information was reviewed with the patient as documented in the AVS. This note was prepared with assistance of Dragon voice recognition software. Occasional wrong-word or sound-a-like substitutions may have occurred due to the inherent limitations of voice recognition software

## 2023-10-27 ENCOUNTER — Encounter: Payer: Self-pay | Admitting: Family Medicine

## 2023-10-27 NOTE — Progress Notes (Signed)
 Labs reviewed.  The 10-year ASCVD risk score (Arnett DK, et al., 2019) is: 2%   Values used to calculate the score:     Age: 53 years     Sex: Female     Is Non-Hispanic African American: No     Diabetic: No     Tobacco smoker: No     Systolic Blood Pressure: 117 mmHg     Is BP treated: Yes     HDL Cholesterol: 57.5 mg/dL     Total Cholesterol: 210 mg/dL

## 2023-12-20 ENCOUNTER — Other Ambulatory Visit: Payer: Self-pay | Admitting: Family Medicine

## 2023-12-22 ENCOUNTER — Encounter: Payer: Self-pay | Admitting: Family Medicine

## 2023-12-22 NOTE — Telephone Encounter (Signed)
 Noted.

## 2023-12-28 ENCOUNTER — Ambulatory Visit: Admitting: Family Medicine

## 2023-12-28 ENCOUNTER — Encounter: Payer: Self-pay | Admitting: Family Medicine

## 2023-12-28 MED ORDER — ZEPBOUND 2.5 MG/0.5ML ~~LOC~~ SOAJ: 2.5000 mg | SUBCUTANEOUS | 2 refills | Status: DC

## 2023-12-28 NOTE — Patient Instructions (Addendum)
 Please return in 3 months for weight check.  If you have any questions or concerns, please don't hesitate to send me a message via MyChart or call the office at [phone]. Thank you for visiting with us  today! It's our pleasure caring for you.   VISIT SUMMARY:  Today, we discussed your interest in starting a GLP-1 medication for weight management and metabolic health. We reviewed your medical history, including your thyroid  surgery, and talked about the potential benefits and side effects of the medication. We also discussed the cost and convenience of using an auto-injector pen.  YOUR PLAN:  -METABOLIC DYSFUNCTION: Metabolic dysfunction refers to problems with the body's ability to properly use and store energy. We discussed starting Zepbound  (tirzepatide ), a GLP-1 receptor agonist, to help with weight loss and improve your metabolic health. You will start with a dose of 2.5 mg once weekly for the first month, and if tolerated, increase to 5 mg once weekly. We talked about managing potential side effects like nausea, headaches, and gastrointestinal issues through proper hydration, protein intake, and timing of the medication. You should use [facility] for your prescription and coupon card processing. Additionally, incorporating strength resistance exercises will be beneficial. We will monitor your progress and side effects, and you should follow up in three months to assess your tolerance and progress.   INSTRUCTIONS:  Please follow up in three months to assess your progress and tolerance to the medication. Use [facility] for your prescription and coupon card processing. Ensure you stay hydrated, maintain a good protein intake, and engage in strength resistance exercises. Monitor for any side effects and adjust management as needed.

## 2023-12-28 NOTE — Progress Notes (Signed)
 Subjective  CC:  Chief Complaint  Patient presents with   Discuss GLP1   Obesity    HPI: Angelica Lester is a 53 y.o. female who presents to the office today to address the problems listed above in the chief complaint. Discussed the use of AI scribe software for clinical note transcription with the patient, who gave verbal consent to proceed.  History of Present Illness   Angelica Lester is a 53 year old female who presents for a consultation regarding GLP-1 medication for weight management.  She has been researching GLP-1 medications and has spoken to friends who are using them. She is interested in starting a GLP-1 medication to aid in weight loss and improve her metabolic condition. She describes feeling like a 'puff marshmallow person' and is seeking a change in her metabolic health.  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 She is concerned about any potential issues with starting GLP-1 medication given her history.  No sleep apnea and has never been tested for it. She is aware that GLP-1 medications have been indicated for sleep apnea in addition to diabetes.  She is informed about the potential side effects of GLP-1 medications, including nausea, headaches, fatigue, and gastrointestinal issues such as constipation or diarrhea. She understands that hydration, protein intake, and timing of the medication can influence side effects.  She is considering starting with an auto-injector pen for ease of use and is aware of the cost implications, including the availability of a coupon card to reduce expenses. She is open to starting with a lower dose to assess tolerance.       Assessment  1. Obesity, morbid (HCC)      Plan  Assessment and Plan    Metabolic dysfunction Discussed GLP-1 receptor agonists, specifically Zepbound  (tirzepatide ), for weight loss and metabolic  improvement. Emphasized gradual weight loss of 1-2 pounds per week over 1-2 years. Explained potential side effects: nausea, headaches, fatigue, gastrointestinal issues. Previous thyroid  surgery does not contraindicate GLP-1 use. Considered medication cost and preference for auto-injector pen. Discussed managing side effects through injection timing, hydration, and nutrition. - Prescribe Zepbound  (tirzepatide ) 2.5 mg once weekly for the first month. - Increase dose to 5 mg once weekly if tolerated after the first month. - Advise using [facility] for prescription fulfillment and coupon card processing. - Encourage hydration, protein intake, and strength resistance exercises. - Monitor for side effects and adjust management as needed. - Schedule follow-up in three months to assess progress and tolerance.        No orders of the defined types were placed in this encounter.  Meds ordered this encounter  Medications   tirzepatide  (ZEPBOUND ) 2.5 MG/0.5ML Pen    Sig: Inject 2.5 mg into the skin once a week.    Dispense:  2 mL    Refill:  2     I reviewed the patients updated PMH, FH, and SocHx.    Patient Active Problem List   Diagnosis Date Noted   Obesity, morbid (HCC) 10/20/2023    Priority: High   Essential hypertension 03/12/2017    Priority: High   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    Priority: High   Obesity (BMI 30-39.9) 03/09/2016    Priority: High   Family history of colon cancer 09/27/2013    Priority: High   Hepatic steatosis 10/23/2020    Priority: Medium  Adjustment insomnia 01/05/2019    Priority: Medium    GERD (gastroesophageal reflux disease) 09/27/2013    Priority: Medium    Irritable bowel syndrome 08/28/2010    Priority: Medium    Migraine headache 02/24/2010    Priority: Medium    Rosacea 10/20/2023    Priority: Low   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   ALPRAZolam  (XANAX ) 0.5 MG tablet Take 0.5-1 tablets (0.25-0.5 mg total) by mouth daily as needed for anxiety.   azelastine  (ASTELIN ) 0.1 % nasal spray Place 1 spray into both nostrils 2 (two) times daily.   cyclobenzaprine  (FLEXERIL ) 10 MG tablet Take 1 tablet (10 mg total) by mouth at bedtime as needed for muscle spasms.   diclofenac  (VOLTAREN ) 75 MG EC tablet Take 1 tablet (75 mg total) by mouth 2 (two) times daily as needed.   dicyclomine  (BENTYL ) 10 MG capsule Take 1 capsule (10 mg total) by mouth 3 (three) times daily before meals. As needed   fluticasone  (FLONASE ) 50 MCG/ACT nasal spray USE 1 SPRAY INTO BOTH NOSTRILS DAILY   hydrochlorothiazide  (MICROZIDE ) 12.5 MG capsule Take 1 capsule (12.5 mg total) by mouth daily.   levothyroxine  (SYNTHROID ) 150 MCG tablet TAKE 1 TABLET BY MOUTH DAILY   lisinopril  (ZESTRIL ) 40 MG tablet Take 1 tablet (40 mg total) by mouth daily.   montelukast  (SINGULAIR ) 10 MG tablet Take 1 tablet (10 mg total) by mouth daily.   tirzepatide  (ZEPBOUND ) 2.5 MG/0.5ML Pen Inject 2.5 mg into the skin once a week.   valACYclovir  (VALTREX ) 1000 MG tablet Take 2 tablets (2,000 mg total) by mouth 2 (two) times daily. Once, as needed for cold sores    Allergies: Patient is allergic to fexofenadine and amoxicillin. Family History: Patient family history includes Alzheimer's disease in her paternal grandmother; Atrial fibrillation in her father and mother; COPD in her mother; Colon cancer in her paternal grandfather; Heart disease in her sister; Hyperlipidemia in her mother; Hypertension in her mother; Liver disease in her  sister. Social History:  Patient  reports that she has never smoked. She has never used smokeless tobacco. She reports current alcohol use of about 3.0 standard drinks of alcohol per week. She reports that she does not use drugs.  Review of Systems: Constitutional: Negative for fever malaise or anorexia Cardiovascular: negative for chest pain Respiratory: negative for SOB or persistent cough Gastrointestinal: negative for abdominal pain  Objective  Vitals: BP 136/82   Pulse 81   Temp 98 F (36.7 C)   Ht 5\' 4"  (1.626 m)   Wt 237 lb 12.8 oz (107.9 kg)   LMP 06/04/2019   SpO2 97%   BMI 40.82 kg/m  General: no acute distress , A&Ox3  Commons side effects, risks, benefits, and alternatives for medications and treatment plan prescribed today were discussed, and the patient expressed understanding of the given instructions. Patient is instructed to call or message via MyChart if he/she has any questions or concerns regarding our treatment plan. No barriers to understanding were  identified. We discussed Red Flag symptoms and signs in detail. Patient expressed understanding regarding what to do in case of urgent or emergency type symptoms.  Medication list was reconciled, printed and provided to the patient in AVS. Patient instructions and summary information was reviewed with the patient as documented in the AVS. This note was prepared with assistance of Dragon voice recognition software. Occasional wrong-word or sound-a-like substitutions may have occurred due to the inherent limitations of voice recognition software

## 2024-02-01 ENCOUNTER — Encounter: Payer: Self-pay | Admitting: Family Medicine

## 2024-02-25 MED ORDER — ZEPBOUND 5 MG/0.5ML ~~LOC~~ SOAJ: 5.0000 mg | SUBCUTANEOUS | 2 refills | Status: DC

## 2024-03-02 DIAGNOSIS — L820 Inflamed seborrheic keratosis: Secondary | ICD-10-CM | POA: Diagnosis not present

## 2024-03-02 DIAGNOSIS — D225 Melanocytic nevi of trunk: Secondary | ICD-10-CM | POA: Diagnosis not present

## 2024-03-02 DIAGNOSIS — D485 Neoplasm of uncertain behavior of skin: Secondary | ICD-10-CM | POA: Diagnosis not present

## 2024-03-02 DIAGNOSIS — Z86018 Personal history of other benign neoplasm: Secondary | ICD-10-CM | POA: Diagnosis not present

## 2024-03-02 DIAGNOSIS — L578 Other skin changes due to chronic exposure to nonionizing radiation: Secondary | ICD-10-CM | POA: Diagnosis not present

## 2024-03-02 DIAGNOSIS — L821 Other seborrheic keratosis: Secondary | ICD-10-CM | POA: Diagnosis not present

## 2024-03-15 DIAGNOSIS — Z1231 Encounter for screening mammogram for malignant neoplasm of breast: Secondary | ICD-10-CM | POA: Diagnosis not present

## 2024-03-15 LAB — HM MAMMOGRAPHY

## 2024-04-14 ENCOUNTER — Encounter: Payer: Self-pay | Admitting: Family Medicine

## 2024-04-14 MED ORDER — ZEPBOUND 7.5 MG/0.5ML ~~LOC~~ SOAJ: 7.5000 mg | SUBCUTANEOUS | 5 refills | Status: DC

## 2024-05-02 ENCOUNTER — Ambulatory Visit: Admitting: Family Medicine

## 2024-05-02 ENCOUNTER — Encounter: Payer: Self-pay | Admitting: Family Medicine

## 2024-05-02 DIAGNOSIS — Z23 Encounter for immunization: Secondary | ICD-10-CM | POA: Diagnosis not present

## 2024-05-02 DIAGNOSIS — J302 Other seasonal allergic rhinitis: Secondary | ICD-10-CM

## 2024-05-02 DIAGNOSIS — I10 Essential (primary) hypertension: Secondary | ICD-10-CM | POA: Diagnosis not present

## 2024-05-02 DIAGNOSIS — T8089XA Other complications following infusion, transfusion and therapeutic injection, initial encounter: Secondary | ICD-10-CM | POA: Diagnosis not present

## 2024-05-02 DIAGNOSIS — F5102 Adjustment insomnia: Secondary | ICD-10-CM

## 2024-05-02 MED ORDER — MONTELUKAST SODIUM 10 MG PO TABS: 10.0000 mg | ORAL_TABLET | Freq: Every day | ORAL | 3 refills | Status: AC

## 2024-05-02 MED ORDER — ALPRAZOLAM 0.5 MG PO TABS: 0.2500 mg | ORAL_TABLET | Freq: Every day | ORAL | 2 refills | Status: AC | PRN

## 2024-05-02 MED ORDER — ONDANSETRON 4 MG PO TBDP: 4.0000 mg | ORAL_TABLET | Freq: Three times a day (TID) | ORAL | 1 refills | Status: AC | PRN

## 2024-05-02 NOTE — Progress Notes (Signed)
 Subjective  CC:  Chief Complaint  Patient presents with   Obesity    HPI: Angelica Lester is a 53 y.o. female who presents to the office today to address the problems listed above in the chief complaint. Discussed the use of AI scribe software for clinical note transcription with the patient, who gave verbal consent to proceed.  History of Present Illness Angelica Lester is a 53 year old female who presents for follow up after starting zepbound . Started about 4 months ago and doing well!  She has been experiencing a rash at the site of her injections, which began after her third injection. The rash is itchy and appears 24 hours post-injection. She has been using Benadryl cream and taking Benadryl orally to manage the itching. She is currently using an auto-injector pen for her medication.She has experienced significant weight loss, starting at 238 pounds and currently weighing 221 pounds, with a goal weight of 175 pounds. She has been logging her weight to track progress. She reports experiencing nausea and fatigue in the mornings during the first two weeks of her medication regimen, which she manages with saltine crackers and ginger ale.  She has a history of high blood pressure. She has not been regularly checking her blood pressure at home. Taking hydrochlorothiazide  and lisinopril . Feels well. Feeling well. Taking medications w/o adverse effects. No symptoms of CHF, angina; no palpitations, sob, cp or lower extremity edema. Compliant with meds.   Allergies: needs refills on singuliar  Adjustment insomnia: controlled with rare xanax .    Assessment  1. Obesity, morbid (HCC)   2. Essential hypertension   3. Irritation of injection site, initial encounter   4. Need for pneumococcal 20-valent conjugate vaccination   5. Adjustment insomnia   6. Seasonal allergies      Plan  Assessment and Plan Assessment & Plan Obesity Weight reduced from 238 lbs to 221 lbs. Goal is 175 lbs.  Emphasized gradual weight loss to prevent muscle and hair loss. - Continue weight loss medication at 7.5 mg. - Plan to increase dosage to 10 or 12.5 mg in the future. - Encourage resistance training and weight lifting. - Monitor weight loss progress regularly.  Hypertension Diastolic pressure slightly elevated. - Check and log blood pressure regularly. - continue hydrochlorothiazide  and lisinopril  - will improve with weight loss  Injection site reaction to weight loss medication Reactions include itching and bruising 24 hours post-injection, starting after the third injection. Current management with Benadryl cream and oral Benadryl causes wakefulness. - Take Zyrtec every night. - Take a full dose of Benadryl on the morning of the injection. - Consider injecting more laterally or in the thigh.  Refilled xanax  for adjustment anxiety related insomnia.  Refilled singulair  for allergies  General Health Maintenance Shingles vaccination is up to date. - Administer pneumonia vaccination. Prevnar 20    Follow up: 6 mo for cpe and f/u chronic problems.  Orders Placed This Encounter  Procedures   Pneumococcal conjugate vaccine 20-valent (Prevnar 20)   Meds ordered this encounter  Medications   ALPRAZolam  (XANAX ) 0.5 MG tablet    Sig: Take 0.5-1 tablets (0.25-0.5 mg total) by mouth daily as needed for anxiety.    Dispense:  30 tablet    Refill:  2   montelukast  (SINGULAIR ) 10 MG tablet    Sig: Take 1 tablet (10 mg total) by mouth daily.    Dispense:  90 tablet    Refill:  3   ondansetron  (ZOFRAN -ODT) 4 MG  disintegrating tablet    Sig: Take 1 tablet (4 mg total) by mouth every 8 (eight) hours as needed for nausea or vomiting.    Dispense:  20 tablet    Refill:  1     I reviewed the patients updated PMH, FH, and SocHx.  Patient Active Problem List   Diagnosis Date Noted   Obesity, morbid (HCC) 10/20/2023    Priority: High   Essential hypertension 03/12/2017    Priority: High    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    Priority: High   Obesity (BMI 30-39.9) 03/09/2016    Priority: High   Family history of colon cancer 09/27/2013    Priority: High   Hepatic steatosis 10/23/2020    Priority: Medium    Adjustment insomnia 01/05/2019    Priority: Medium    GERD (gastroesophageal reflux disease) 09/27/2013    Priority: Medium    Irritable bowel syndrome 08/28/2010    Priority: Medium    Migraine headache 02/24/2010    Priority: Medium    Rosacea 10/20/2023    Priority: Low   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   azelastine  (ASTELIN ) 0.1 % nasal spray Place 1 spray into both nostrils 2 (two) times daily.   cyclobenzaprine  (FLEXERIL ) 10 MG tablet Take 1 tablet (10 mg total) by mouth at bedtime as needed for muscle spasms.   diclofenac  (VOLTAREN ) 75 MG EC tablet Take 1 tablet (75 mg total) by mouth 2 (two) times daily as needed.   dicyclomine  (BENTYL ) 10 MG capsule Take 1 capsule (10 mg total) by mouth 3 (three) times daily before meals. As needed   fluticasone  (FLONASE ) 50 MCG/ACT nasal spray USE 1 SPRAY INTO BOTH NOSTRILS DAILY   hydrochlorothiazide  (MICROZIDE ) 12.5 MG capsule Take 1 capsule (12.5 mg total) by mouth daily.   levothyroxine  (SYNTHROID ) 150 MCG tablet TAKE 1 TABLET BY MOUTH DAILY   lisinopril  (ZESTRIL ) 40 MG tablet Take 1 tablet (40 mg total) by mouth daily.   ondansetron  (ZOFRAN -ODT) 4 MG disintegrating tablet Take 1 tablet (4 mg total) by mouth every 8 (eight) hours as needed for nausea or vomiting.   tirzepatide   (ZEPBOUND ) 7.5 MG/0.5ML Pen Inject 7.5 mg into the skin once a week.   valACYclovir  (VALTREX ) 1000 MG tablet Take 2 tablets (2,000 mg total) by mouth 2 (two) times daily. Once, as needed for cold sores   [DISCONTINUED] ALPRAZolam  (XANAX ) 0.5 MG tablet Take 0.5-1 tablets (0.25-0.5 mg total) by mouth daily as needed for anxiety.   [DISCONTINUED] montelukast  (SINGULAIR ) 10 MG tablet Take 1 tablet (10 mg total) by mouth daily.   Allergies: Patient is allergic to fexofenadine and amoxicillin. Family History: Patient family history includes Alzheimer's disease in her paternal grandmother; Atrial fibrillation in her father and mother; COPD in her mother; Colon cancer in her paternal grandfather; Heart disease in her sister; Hyperlipidemia in her mother; Hypertension in her mother; Liver disease in her sister. Social History:  Patient  reports that she has never smoked. She has never used smokeless  tobacco. She reports current alcohol use of about 3.0 standard drinks of alcohol per week. She reports that she does not use drugs.  Review of Systems: Constitutional: Negative for fever malaise or anorexia Cardiovascular: negative for chest pain Respiratory: negative for SOB or persistent cough Gastrointestinal: negative for abdominal pain  Objective  Vitals: BP 121/84   Pulse 76   Temp 97.7 F (36.5 C)   Ht 5' 4 (1.626 m)   Wt 225 lb 9.6 oz (102.3 kg)   LMP 06/04/2019   SpO2 99%   BMI 38.72 kg/m  General: no acute distress , A&Ox3 HEENT: PEERL, conjunctiva normal, neck is supple Cardiovascular:  RRR without murmur or gallop.  Respiratory:  Good breath sounds bilaterally, CTAB with normal respiratory effort Skin:  Warm, lower abdomen with light macular rash at injection site. No warmth or fluctuance or ttp  Commons side effects, risks, benefits, and alternatives for medications and treatment plan prescribed today were discussed, and the patient expressed understanding of the given  instructions. Patient is instructed to call or message via MyChart if he/she has any questions or concerns regarding our treatment plan. No barriers to understanding were identified. We discussed Red Flag symptoms and signs in detail. Patient expressed understanding regarding what to do in case of urgent or emergency type symptoms.  Medication list was reconciled, printed and provided to the patient in AVS. Patient instructions and summary information was reviewed with the patient as documented in the AVS. This note was prepared with assistance of Dragon voice recognition software. Occasional wrong-word or sound-a-like substitutions may have occurred due to the inherent limitations of voice recognition software

## 2024-05-02 NOTE — Patient Instructions (Addendum)
 Please return in 6 months for your annual complete physical; please come fasting.  For follow up on chronic medical conditions   If you have any questions or concerns, please don't hesitate to send me a message via MyChart or call the office at [phone]. Thank you for visiting with us  today! It's our pleasure caring for you.    VISIT SUMMARY: You came in today for a routine physical exam. We discussed your recent experiences with a rash at your injection site, your current medications, weight loss progress, and morning nausea and fatigue. We also reviewed your history of high blood pressure and general health maintenance needs.  YOUR PLAN: -OBESITY: Obesity means having an excessive amount of body fat. Your weight has reduced from 238 lbs to 221 lbs, with a goal of 175 lbs. Continue your weight loss medication at 7.5 mg, and we may increase the dosage to 10 or 12.5 mg in the future. I encourage you to engage in resistance training and weight lifting, and to monitor your weight loss progress regularly.  -HYPERTENSION: Hypertension is high blood pressure. Your diastolic pressure is slightly elevated. Please check and log your blood pressure regularly.  -INJECTION SITE REACTION TO WEIGHT LOSS MEDICATION: You are experiencing itching and bruising at the injection site 24 hours post-injection. This started after your third injection. To manage this, take Zyrtec every night and a full dose of Benadryl on the morning of the injection. You might also consider injecting more laterally or in the thigh.  -GENERAL HEALTH MAINTENANCE: You are two years overdue for your pneumonia vaccination, although your shingles vaccination is up to date. We will administer the pneumonia vaccination today.  INSTRUCTIONS: Please follow up by regularly checking and logging your blood pressure. Continue monitoring your weight loss progress and consider the suggested changes to your injection routine. If you experience any  new or worsening symptoms, please contact our office.                      Contains text generated by [facility].                                 Contains text generated by [facility].

## 2024-05-03 ENCOUNTER — Ambulatory Visit: Admitting: Family Medicine

## 2024-06-21 ENCOUNTER — Encounter: Payer: Self-pay | Admitting: Family Medicine

## 2024-06-21 ENCOUNTER — Telehealth: Admitting: Family Medicine

## 2024-06-21 VITALS — Ht 64.0 in | Wt 222.0 lb

## 2024-06-21 DIAGNOSIS — J0101 Acute recurrent maxillary sinusitis: Secondary | ICD-10-CM

## 2024-06-21 DIAGNOSIS — Z6838 Body mass index (BMI) 38.0-38.9, adult: Secondary | ICD-10-CM | POA: Diagnosis not present

## 2024-06-21 DIAGNOSIS — L270 Generalized skin eruption due to drugs and medicaments taken internally: Secondary | ICD-10-CM | POA: Diagnosis not present

## 2024-06-21 MED ORDER — PREDNISONE 10 MG PO TABS
ORAL_TABLET | ORAL | 0 refills | Status: AC
Start: 2024-06-21 — End: ?

## 2024-06-21 MED ORDER — AZITHROMYCIN 250 MG PO TABS
ORAL_TABLET | ORAL | 0 refills | Status: AC
Start: 2024-06-21 — End: ?

## 2024-06-21 NOTE — Progress Notes (Signed)
 Virtual Visit via Video Note  Subjective  CC:  Chief Complaint  Patient presents with   Headache    Pt stated that she feels like she has a sinus infection. Took COVID test yesterday and it was negative. She is having some nasal congestion, along with nose burning, cough stuffiness, and yellow mucus     I connected with [name] on 06/21/24 at 10:00 AM EDT by a video enabled telemedicine application and verified that I am speaking with the correct person using two identifiers. Location patient: Home Location provider: [facility] [address] Office Persons participating in the virtual visit: Angelica Lester, Lavern LITTIE Heck, MD patricia kennedy CMA  I discussed the limitations of evaluation and management by telemedicine and the availability of in person appointments. The patient expressed understanding and agreed to proceed. HPI: Angelica Lester is a 53 y.o. female who was contacted today to address the problems listed above in the chief complaint. Discussed the use of AI scribe software for clinical note transcription with the patient, who gave verbal consent to proceed.  History of Present Illness Angelica Lester is a 53 year old female who presents with symptoms suggestive of a sinus infection.  Upper respiratory symptoms - Onset of symptoms Monday night with burning sensation in the nose after three days of wind exposure - Progression to pain, nasal stuffiness, and yellow nasal discharge, predominantly from the right side, by the following day - Significant pain and pressure in the sinus area - No dental pain  Cough and post-nasal drip - Development of cough by Wednesday morning - Cough attributed to post-nasal drainage into the throat and chest  Glp-1 agonist therapy and adverse effects - Currently taking a GLP-1 medication at a dose of 7.5 mg weekly for weight loss - Mild rash at the injection site, less severe when injected into the thigh compared  to the stomach - Rash managed with topical treatments - Mild nausea on the first or second day after injection, not severe  Allergic symptoms and management - Takes Zyrtec daily and Singulair  as needed for allergies  Weight loss and physical activity - Weight loss of approximately 14-15 pounds since May - Engages in daily walking for exercise - Desires to incorporate more weight training into her routine    Assessment  1. Acute recurrent maxillary sinusitis   2. Obesity, morbid (HCC)   3. Allergic drug rash      Plan  Assessment and Plan Assessment & Plan Acute right-sided sinusitis Acute right-sided sinusitis with burning, pain, stuffiness, and yellow nasal discharge. Symptoms began Monday night after wind exposure. Cough indicates post-nasal drip. No dental pain. Prefers azithromycin  (Z-Pak) and prednisone  based on past positive experiences. - Prescribe azithromycin  (Z-Pak) - Prescribe prednisone   Obesity Obesity managed with GLP-1 agonist therapy. Weight loss of 14-15 pounds since May, averaging about a pound per week. Tolerating 7.5 mg dose with mild nausea on the first or second day post-injection. No significant side effects. Considering dose increase after another month at 7.5 mg. - Continue current GLP-1 agonist dose of 7.5 mg for another month - Encourage daily walking - Recommend incorporating weight training exercises at home  Injection site rash from GLP-1 agonist therapy Injection site rash less severe when injected in the thigh compared to the stomach. Rash size decreased with topical treatments. Relief found using Flonase  and first aid cream, considering hydrocortisone cream. - Consider using hydrocortisone cream for rash - Take  Benadryl 20 minutes before injection if tolerable  Allergic rhinitis Allergic rhinitis managed with daily Zyrtec and Singulair  as needed. Uses Singulair  on days when allergies are bothersome or on the day of GLP-1 injection. - Continue  daily Zyrtec - Use Singulair  as needed for allergy symptoms     I discussed the assessment and treatment plan with the patient. The patient was provided an opportunity to ask questions and all were answered. The patient agreed with the plan and demonstrated an understanding of the instructions.   The patient was advised to call back or seek an in-person evaluation if the symptoms worsen or if the condition fails to improve as anticipated. Follow up: cpe 10/24/2024  Meds ordered this encounter  Medications   azithromycin  (ZITHROMAX ) 250 MG tablet    Sig: Take 2 tabs today, then 1 tab daily for 4 days    Dispense:  1 each    Refill:  0   predniSONE  (DELTASONE ) 10 MG tablet    Sig: Take 4 tabs qd x 2 days, 3 qd x 2 days, 2 qd x 2d, 1qd x 3 days    Dispense:  21 tablet    Refill:  0      I reviewed the patients updated PMH, FH, and SocHx.    Patient Active Problem List   Diagnosis Date Noted   Obesity, morbid (HCC) 10/20/2023    Priority: High   Essential hypertension 03/12/2017    Priority: High   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    Priority: High   Obesity (BMI 30-39.9) 03/09/2016    Priority: High   Family history of colon cancer 09/27/2013    Priority: High   Hepatic steatosis 10/23/2020    Priority: Medium    Adjustment insomnia 01/05/2019    Priority: Medium    GERD (gastroesophageal reflux disease) 09/27/2013    Priority: Medium    Irritable bowel syndrome 08/28/2010    Priority: Medium    Migraine headache 02/24/2010    Priority: Medium    Rosacea 10/20/2023    Priority: Low   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   ALPRAZolam  (XANAX ) 0.5 MG tablet Take 0.5-1 tablets (0.25-0.5 mg total) by mouth daily as needed for anxiety.   azelastine  (ASTELIN ) 0.1 % nasal spray Place 1 spray into both nostrils 2 (two) times daily.   azithromycin  (ZITHROMAX ) 250 MG tablet Take 2 tabs today, then 1 tab daily for 4 days   cyclobenzaprine  (FLEXERIL ) 10 MG tablet Take 1 tablet (10 mg total) by mouth at bedtime as needed for muscle spasms.   diclofenac  (VOLTAREN ) 75 MG EC tablet Take 1 tablet (75 mg total) by mouth 2 (two) times daily as needed.   dicyclomine  (BENTYL ) 10 MG capsule Take 1 capsule (10 mg total) by mouth 3 (three) times daily before meals. As needed   fluticasone  (FLONASE ) 50 MCG/ACT nasal spray USE 1 SPRAY INTO BOTH NOSTRILS DAILY   hydrochlorothiazide  (MICROZIDE ) 12.5 MG capsule Take 1 capsule (12.5 mg total) by mouth  daily.   levothyroxine  (SYNTHROID ) 150 MCG tablet TAKE 1 TABLET BY MOUTH DAILY   lisinopril  (ZESTRIL ) 40 MG tablet Take 1 tablet (40 mg total) by mouth daily.   montelukast  (SINGULAIR ) 10 MG tablet Take 1 tablet (10 mg total) by mouth daily.   ondansetron  (ZOFRAN -ODT) 4 MG disintegrating tablet Take 1 tablet (4 mg total) by mouth every 8 (eight) hours as needed for nausea or vomiting.   predniSONE  (DELTASONE ) 10 MG tablet Take 4 tabs qd x 2 days, 3 qd x 2 days, 2 qd x 2d, 1qd x 3 days   tirzepatide  (ZEPBOUND ) 7.5 MG/0.5ML Pen Inject 7.5 mg into the skin once a week.   valACYclovir  (VALTREX ) 1000 MG tablet Take 2 tablets (2,000 mg total) by mouth 2 (two) times daily. Once, as needed for cold sores    Allergies: Patient is allergic to fexofenadine and amoxicillin. Family History: Patient family history includes Alzheimer's disease in her paternal grandmother; Atrial fibrillation in her father and mother; COPD in her mother; Colon cancer in her paternal grandfather; Heart disease in her sister;  Hyperlipidemia in her mother; Hypertension in her mother; Liver disease in her sister. Social History:  Patient  reports that she has never smoked. She has never used smokeless tobacco. She reports current alcohol use of about 3.0 standard drinks of alcohol per week. She reports that she does not use drugs.  Review of Systems: Constitutional: Negative for fever malaise or anorexia Cardiovascular: negative for chest pain Respiratory: negative for SOB or persistent cough Gastrointestinal: negative for abdominal pain  OBJECTIVE Vitals: Ht 5' 4 (1.626 m)   Wt 222 lb (100.7 kg)   LMP 06/04/2019   BMI 38.11 kg/m  General: no acute distress , A&Ox3 Congested, facial swelling present, dry cough, no respiratory distress  Wt Readings from Last 3 Encounters:  06/21/24 222 lb (100.7 kg)  05/02/24 225 lb 9.6 oz (102.3 kg)  12/28/23 237 lb 12.8 oz (107.9 kg)     Lavern LITTIE Heck, MD

## 2024-06-21 NOTE — Patient Instructions (Signed)
 Please follow up as scheduled for your next visit with me: 10/24/2024   If you have any questions or concerns, please don't hesitate to send me a message via MyChart or call the office at [phone]. Thank you for visiting with us  today! It's our pleasure caring for you.    VISIT SUMMARY: Today, you were seen for symptoms of a sinus infection, including nasal stuffiness, pain, and yellow discharge. We also discussed your ongoing weight loss efforts and management of your allergies.  YOUR PLAN: -ACUTE RIGHT-SIDED SINUSITIS: You have a sinus infection on the right side, causing burning, pain, stuffiness, and yellow nasal discharge. This likely started after wind exposure. We will treat this with azithromycin  (Z-Pak) and prednisone  to help reduce the infection and inflammation.  -OBESITY: You are managing your weight with a GLP-1 medication and have lost about 14-15 pounds since May. Continue your current dose of 7.5 mg for another month. Keep up with your daily walking and try to add weight training exercises at home.  -INJECTION SITE RASH FROM GLP-1 AGONIST THERAPY: You have a mild rash at the injection site of your GLP-1 medication, which is less severe when injected into the thigh. You can use hydrocortisone cream for the rash and take Benadryl 20 minutes before the injection if you can tolerate it.  -ALLERGIC RHINITIS: You have allergies that are managed with daily Zyrtec and Singulair  as needed. Continue taking Zyrtec every day and use Singulair  when your allergies are bothersome or on the day of your GLP-1 injection.  INSTRUCTIONS: Please follow up as needed if your symptoms do not improve or if you have any concerns. Continue with your current medications and lifestyle changes as discussed.                      Contains text generated by Abridge.                                 Contains text generated by Abridge.

## 2024-07-07 ENCOUNTER — Telehealth: Payer: Self-pay

## 2024-07-07 NOTE — Telephone Encounter (Signed)
 Copied from CRM #[id number]. Topic: Clinical - Medication Question >> Jul 07, 2024  3:25 PM Rea ORN wrote: Reason for CRM: Norleen with [facility] called to advise PCP that they have changed manufacturers for levothyroxine . Per [place] law they have to let PCP know and get written or verbal response that she has been notified and that she is ok with the change.  Please call back [phone].  Pharmacy has been made aware for the change

## 2024-07-12 ENCOUNTER — Encounter: Payer: Self-pay | Admitting: Family Medicine

## 2024-07-13 ENCOUNTER — Telehealth: Payer: Self-pay

## 2024-07-13 MED ORDER — TIRZEPATIDE 10 MG/0.5ML ~~LOC~~ SOAJ: 10.0000 mg | SUBCUTANEOUS | 5 refills | Status: DC

## 2024-07-13 NOTE — Telephone Encounter (Signed)
 [name] is approved exclusively as an adjunct to diet and exercise to improve glycemic control in adults with type 2 diabetes mellitus. A review of patient's medical chart reveals no documented diagnosis of type 2 diabetes or an A1C indicative of diabetes. Therefore, they do not currently meet the criteria for prior authorization of this medication. If clinically appropriate, alternative  options such as Saxenda, Zepbound, or Wegovy  may be considered for this patient.

## 2024-07-17 MED ORDER — ZEPBOUND 10 MG/0.5ML ~~LOC~~ SOAJ: 10.0000 mg | SUBCUTANEOUS | 5 refills | Status: AC

## 2024-07-20 ENCOUNTER — Other Ambulatory Visit (HOSPITAL_COMMUNITY): Payer: Self-pay

## 2024-07-20 ENCOUNTER — Telehealth: Payer: Self-pay

## 2024-07-20 NOTE — Telephone Encounter (Signed)
 Pharmacy Patient Advocate Encounter   Received notification from Onbase that prior authorization for Zepbound  10MG /0.5ML pen-injectors is required/requested.   Insurance verification completed.   The patient is insured through [facility].   Per test claim:

## 2024-08-15 ENCOUNTER — Other Ambulatory Visit: Payer: Self-pay | Admitting: Family Medicine

## 2024-08-15 NOTE — Telephone Encounter (Signed)
 05/02/2024 lov  06/22/2024 fill date  6 each/0 refills

## 2024-10-24 ENCOUNTER — Encounter: Admitting: Family Medicine
